# Patient Record
Sex: Female | Born: 1955 | Race: White | Hispanic: No | Marital: Married | Smoking: Former smoker
Health system: Southern US, Community
[De-identification: ages and names within clinical notes are randomized; demographics above are authoritative.]

## PROBLEM LIST (undated history)

## (undated) DIAGNOSIS — E7800 Pure hypercholesterolemia, unspecified: Secondary | ICD-10-CM

## (undated) DIAGNOSIS — I10 Essential (primary) hypertension: Secondary | ICD-10-CM

## (undated) DIAGNOSIS — F32A Depression, unspecified: Secondary | ICD-10-CM

## (undated) DIAGNOSIS — E119 Type 2 diabetes mellitus without complications: Secondary | ICD-10-CM

## (undated) DIAGNOSIS — F419 Anxiety disorder, unspecified: Secondary | ICD-10-CM

## (undated) DIAGNOSIS — F329 Major depressive disorder, single episode, unspecified: Secondary | ICD-10-CM

## (undated) HISTORY — PX: TONSILLECTOMY: SUR1361

## (undated) HISTORY — PX: ANAL FISSURE REPAIR: SHX2312

---

## 2012-02-17 ENCOUNTER — Emergency Department
Admission: EM | Admit: 2012-02-17 | Discharge: 2012-02-17 | Disposition: A | Discharge status: Home Health | Payer: Managed Care, Other (non HMO) | Source: Home / Self Care | Attending: Emergency Medicine | Admitting: Emergency Medicine

## 2012-02-17 DIAGNOSIS — N3000 Acute cystitis without hematuria: Secondary | ICD-10-CM

## 2012-02-17 HISTORY — DX: Essential (primary) hypertension: I10

## 2012-02-17 HISTORY — DX: Anxiety disorder, unspecified: F41.9

## 2012-02-17 HISTORY — DX: Depression, unspecified: F32.A

## 2012-02-17 HISTORY — DX: Major depressive disorder, single episode, unspecified: F32.9

## 2012-02-17 HISTORY — DX: Pure hypercholesterolemia, unspecified: E78.00

## 2012-02-17 LAB — POCT URINALYSIS DIP (MANUAL ENTRY)
Bilirubin, UA: NEGATIVE
Blood, UA: NEGATIVE
Glucose, UA: 100
Nitrite, UA: POSITIVE
Spec Grav, UA: 1.01 (ref 1.005–1.03)
Urobilinogen, UA: 1 (ref 0–1)
pH, UA: 6 (ref 5–8)

## 2012-02-17 MED ORDER — CIPROFLOXACIN HCL 500 MG PO TABS: 500.0000 mg | ORAL_TABLET | Freq: Two times a day (BID) | ORAL | Status: AC

## 2012-02-17 NOTE — ED Provider Notes (Signed)
History    This is a 56 y.o. female who presents today with UTI symptoms for 1 day.  + dysuria + frequency + urgency No hematuria No vaginal discharge No fever/chills No lower abdominal pain No nausea No vomiting No back pain No fatigue She denies chance of pregnancy. Has tried over-the-counter measures without improvement.    CSN: [id number]  Arrival date & time 02/17/12  1831   First MD Initiated Contact with Patient 02/17/12 1837      Chief Complaint  Patient presents with  . Urinary Frequency     HPI  Past Medical History  Diagnosis Date  . Hypertension   . Depression   . Hypercholesteremia   . Anxiety     Past Surgical History  Procedure Date  . Tonsillectomy   . Cesarean section   . Anal fissure repair     No family history on file.  History  Substance Use Topics  . Smoking status: Current Everyday Smoker  . Smokeless tobacco: Not on file  . Alcohol Use: Yes    OB History    Grav Para Term Preterm Abortions TAB SAB Ect Mult Living                  Review of Systems  Constitutional: Negative.   HENT: Negative.   Eyes: Negative.   Respiratory: Negative.   Cardiovascular: Negative.   Gastrointestinal: Negative.   Genitourinary: Positive for dysuria, urgency and frequency.  Musculoskeletal: Negative.   Neurological: Negative.   Hematological: Negative.   Psychiatric/Behavioral: Negative.     Allergies  Penicillins and Sulfa antibiotics  Home Medications   Current Outpatient Rx  Name Route Sig Dispense Refill  . ALPRAZOLAM 1 MG PO TABS Oral Take 1 mg by mouth at bedtime as needed.    Marland Kitchen AMITRIPTYLINE HCL 150 MG PO TABS Oral Take 150 mg by mouth at bedtime.    . ATORVASTATIN CALCIUM 10 MG PO TABS Oral Take 10 mg by mouth daily.    Marland Kitchen LISINOPRIL 20 MG PO TABS Oral Take 20 mg by mouth daily.    Marland Kitchen CIPROFLOXACIN HCL 500 MG PO TABS Oral Take 1 tablet (500 mg total) by mouth 2 (two) times daily. 14 tablet 0    BP 130/88  Pulse 101   Temp 97.7 F (36.5 C) (Oral)  Resp 16  SpO2 99%  Physical Exam  Nursing note and vitals reviewed. Constitutional: She is oriented to person, place, and time. She appears well-developed and well-nourished. No distress.  HENT:  Mouth/Throat: Oropharynx is clear and moist.  Eyes: No scleral icterus.  Neck: Neck supple.  Cardiovascular: Normal rate, regular rhythm and normal heart sounds.   Pulmonary/Chest: Breath sounds normal.  Abdominal: Soft. She exhibits no mass. There is no hepatosplenomegaly. There is tenderness in the suprapubic area. There is no rebound, no guarding and no CVA tenderness.  Lymphadenopathy:    She has no cervical adenopathy.  Neurological: She is alert and oriented to person, place, and time.  Skin: Skin is warm and dry.    ED Course  Procedures (including critical care time)   Labs Reviewed  POCT URINALYSIS DIP (MANUAL ENTRY)  URINE CULTURE   No results found.   1. Cystitis, acute       MDM  Acute cystitis, uncomplicated. Urinalysis positive for nitrates and leukocytes. (UA positive for 100 mg per DL glucose, consistent with her diabetes-she otherwise denies any signs or symptoms or hyperglycemia, and she states her diabetes is fairly well  controlled, followed ongoing by her PCP)  Treatment options discussed. Cipro prescribed for 7 days. Other symptomatic care discussed. Questions invited and answered. Patient and her husband voiced understanding and agreement. Urine culture sent off, especially because she's had UTIs in the past. I explained that she would need to followup with her PCP after a week of treatment to be sure that the urinary infection has resolved and the UA has cleared. Other potential red flags discussed. Followup here sooner if any worsening or new symptoms. Also advised to followup with PCP for ongoing management of diabetes and other problems.        Lajean Manes, MD 02/17/12 [phone]

## 2012-02-17 NOTE — ED Notes (Signed)
Pt c/o increased urgency and frequency of urination.  Pt denies dysuria, hematuria.  Onset of SX this am, pt began taking AZO.

## 2012-02-19 LAB — URINE CULTURE
Colony Count: NO GROWTH
Organism ID, Bacteria: NO GROWTH

## 2012-02-20 ENCOUNTER — Telehealth: Payer: Self-pay | Admitting: Family Medicine

## 2012-02-22 ENCOUNTER — Telehealth: Payer: Self-pay | Admitting: *Deleted

## 2012-11-09 ENCOUNTER — Encounter: Payer: Self-pay | Admitting: *Deleted

## 2012-11-09 ENCOUNTER — Emergency Department
Admission: EM | Admit: 2012-11-09 | Discharge: 2012-11-09 | Disposition: A | Discharge status: Home / Self Care | Payer: Managed Care, Other (non HMO) | Source: Home / Self Care | Attending: Family Medicine | Admitting: Family Medicine

## 2012-11-09 DIAGNOSIS — M549 Dorsalgia, unspecified: Secondary | ICD-10-CM

## 2012-11-09 DIAGNOSIS — R319 Hematuria, unspecified: Secondary | ICD-10-CM

## 2012-11-09 HISTORY — DX: Type 2 diabetes mellitus without complications: E11.9

## 2012-11-09 LAB — POCT CBC W AUTO DIFF (K'VILLE URGENT CARE)

## 2012-11-09 LAB — POCT URINALYSIS DIP (MANUAL ENTRY)
Bilirubin, UA: NEGATIVE
Glucose, UA: NEGATIVE
Ketones, POC UA: NEGATIVE
Leukocytes, UA: NEGATIVE
Nitrite, UA: NEGATIVE
pH, UA: 6 (ref 5–8)

## 2012-11-09 MED ORDER — KETOROLAC TROMETHAMINE 30 MG/ML IJ SOLN
30.0000 mg | Freq: Once | INTRAMUSCULAR | Status: AC
  Administered 2012-11-09: 30 mg via INTRAMUSCULAR

## 2012-11-09 MED ORDER — CEPHALEXIN 500 MG PO CAPS: 500.0000 mg | ORAL_CAPSULE | Freq: Three times a day (TID) | ORAL | Status: DC

## 2012-11-09 NOTE — ED Provider Notes (Signed)
History     CSN: [id number]  Arrival date & time 11/09/12  1236   First MD Initiated Contact with Patient 11/09/12 1353      Chief Complaint  Patient presents with  . Back Pain       HPI Comments: Patient noticed mild suprapubic bladder pressure two days ago.  She awoke this morning with lower back pain, initially on the right and now both sides.  The pain has radiated somewhat to her abdomen.  She had chills.  She noticed that her urine was dark, and she has had urinary urgency but no dysuria.  No nausea/vomiting.   No pelvic pain and no vaginal discharge.  She had a normal pap smear about 6 months ago. She had a kidney stone when she was in her thirty's.  Patient is a 57 y.o. female presenting with back pain. The history is provided by the patient.  Back Pain Location:  Lumbar spine Quality:  Aching Radiates to: abdomen. Pain severity:  Moderate Pain is:  Worse during the day Onset quality:  Sudden Duration:  6 hours Timing:  Constant Progression:  Improving Chronicity:  New Relieved by:  Nothing Ineffective treatments:  None tried Associated symptoms: abdominal pain   Associated symptoms: no abdominal swelling, no bladder incontinence, no bowel incontinence, no chest pain, no dysuria, no fever, no numbness, no paresthesias, no pelvic pain, no perianal numbness and no tingling     Past Medical History  Diagnosis Date  . Hypertension   .  Depression   . Hypercholesteremia   . Anxiety   . Diabetes mellitus without complication     Past Surgical History  Procedure Laterality Date  . Tonsillectomy    . Cesarean section    . Anal fissure repair      Family History  Problem Relation Age of Onset  . Diabetes Mother     History  Substance Use Topics  . Smoking status: Current Every Day Smoker  . Smokeless tobacco: Never Used  . Alcohol Use: Yes    OB History   Grav Para Term Preterm Abortions TAB SAB Ect Mult Living                  Review of Systems  Constitutional: Negative for fever.  Cardiovascular: Negative for chest pain.  Gastrointestinal: Positive for abdominal pain. Negative for bowel incontinence.  Genitourinary: Negative for bladder incontinence, dysuria and pelvic pain.  Musculoskeletal: Positive for back pain.  Neurological: Negative for tingling, numbness and paresthesias.  All other systems reviewed and are negative.    Allergies  Penicillins and Sulfa antibiotics  Home Medications   Current Outpatient Rx  Name  Route  Sig  Dispense  Refill  . metFORMIN (GLUCOPHAGE) 500 MG tablet   Oral   Take 500 mg by mouth 2 (two) times daily with a meal.         . ALPRAZolam (XANAX) 1 MG tablet   Oral   Take 1 mg by mouth at bedtime as needed.         Marland Kitchen amitriptyline (ELAVIL) 150 MG tablet   Oral   Take 150 mg by mouth at bedtime.         Marland Kitchen atorvastatin (LIPITOR) 10 MG tablet   Oral   Take 10 mg by mouth daily.         . cephALEXin (KEFLEX) 500 MG capsule   Oral   Take 1 capsule (500 mg total) by mouth 3 (three) times daily.   30 capsule   0   . lisinopril (PRINIVIL,ZESTRIL) 20 MG tablet   Oral   Take 20 mg by mouth daily.           BP 149/98  Pulse 98  Temp(Src) 98 F (36.7 C) (Oral)  Resp 14  Ht 5\' 5"  (1.651 m)  Wt 163 lb (73.936 kg)  BMI 27.12 kg/m2  SpO2 99%  Physical Exam Nursing notes and Vital Signs reviewed. Appearance:  Patient appears healthy,  stated age, and in no acute distress Eyes:  Pupils are equal, round, and reactive to light and accomodation.  Extraocular movement is intact.  Conjunctivae are not inflamed   Nose:   Normal turbinates.  No sinus tenderness.   Pharynx:  Normal Neck:  Supple.  No adenopathy Lungs:  Clear to  auscultation.  Breath sounds are equal.  Heart:  Regular rate and rhythm without murmurs, rubs, or gallops.  Abdomen:   Vague peri-umbilical tenderness without masses or hepatosplenomegaly.  Bowel sounds are present and increased.  Right flank tenderness is present.                                                                   Extremities:  No edema.  No calf tenderness Skin:  No rash present.   ED Course  Procedures  none  Labs Reviewed  URINE CULTURE pending  POCT URINALYSIS DIP (MANUAL ENTRY) BLO large; PRO 100mg /dL, otherwise negative  POCT CBC W AUTO DIFF ([facility])  WBC 7.7; LY 24.3; MO 4.0; GR 71.7; Hgb 12.3; Platelets 229       1. Hematuria; ? Recurrent nephrolithiasis   2. Back pain       MDM  Urine culture pending Begin Keflex. Increase fluid intake.  May take Tylenol for pain. Followup with Family Doctor if not improved in four days.  If symptoms become significantly worse during the night or over the weekend, proceed to the local emergency room.        Lattie Haw, MD 11/09/12 [phone]

## 2012-11-09 NOTE — ED Notes (Signed)
Margaret Mcgee c/o low back pain and dark urine x this am. Taken tylenol with relief for 2 hours.

## 2012-11-12 ENCOUNTER — Telehealth: Payer: Self-pay | Admitting: *Deleted

## 2012-11-12 LAB — URINE CULTURE

## 2012-11-25 ENCOUNTER — Emergency Department
Admission: EM | Admit: 2012-11-25 | Discharge: 2012-11-25 | Disposition: A | Discharge status: Home / Self Care | Payer: Managed Care, Other (non HMO) | Source: Home / Self Care

## 2012-11-25 DIAGNOSIS — Z716 Tobacco abuse counseling: Secondary | ICD-10-CM

## 2012-11-25 DIAGNOSIS — N390 Urinary tract infection, site not specified: Secondary | ICD-10-CM

## 2012-11-25 LAB — POCT URINALYSIS DIP (MANUAL ENTRY)
Bilirubin, UA: NEGATIVE
Glucose, UA: NEGATIVE
Leukocytes, UA: NEGATIVE
pH, UA: 5.5 (ref 5–8)

## 2012-11-25 MED ORDER — NITROFURANTOIN MONOHYD MACRO 100 MG PO CAPS: 100.0000 mg | ORAL_CAPSULE | Freq: Two times a day (BID) | ORAL | Status: DC

## 2012-11-25 NOTE — ED Provider Notes (Signed)
History     CSN: [id number]  Arrival date & time 11/25/12  1659   None     Chief Complaint  Patient presents with  . Back Pain  . Urinary Urgency   HPI  DYSURIA Onset:  3-4 days  Description: dysuria, increased urinary frequency and urgency  Modifying factors: was seen 4/3 for similar sxs. Completed 10+ day course of kelfex with improvement in sxs. Sxs then recurred over last 4-5 days   Symptoms Urgency:  yes Frequency: yes  Hesitancy:  yes Hematuria:  no Flank Pain:  no Fever: no Nausea/Vomiting:  no Missed LMP: no STD exposure: no Discharge: no Irritants: no Rash: no  Red Flags   More than 3 UTI's last 12 months:  yes PMH of  Diabetes or Immunosuppression:  Yes; last A1C 6.9  Renal Disease/Calculi: prior history 10+ years ago  Urinary Tract Abnormality:  no Instrumentation or Trauma: no    Past Medical History  Diagnosis Date  . Hypertension   . Depression   . Hypercholesteremia   . Anxiety   . Diabetes mellitus without complication     Past Surgical History  Procedure Laterality Date  . Tonsillectomy    . Cesarean section    . Anal fissure repair      Family History  Problem Relation Age of Onset  . Diabetes Mother     History  Substance Use Topics  . Smoking status: Current Every Day Smoker  . Smokeless tobacco: Never Used  . Alcohol Use: Yes    OB History   Grav Para Term Preterm Abortions TAB SAB Ect Mult Living                  Review of Systems  All other systems reviewed and are negative.    Allergies  Penicillins and Sulfa antibiotics  Home Medications   Current Outpatient Rx  Name  Route  Sig  Dispense  Refill  . ALPRAZolam (XANAX) 1 MG tablet   Oral   Take 1 mg by mouth at bedtime as needed.         Marland Kitchen amitriptyline (ELAVIL) 150 MG tablet   Oral   Take 150 mg by mouth at bedtime.         Marland Kitchen atorvastatin (LIPITOR) 10 MG tablet   Oral   Take 10 mg by mouth daily.         . cephALEXin (KEFLEX) 500 MG  capsule   Oral   Take 1 capsule (500 mg total) by mouth 3 (three) times daily.   30 capsule   0   . lisinopril (PRINIVIL,ZESTRIL) 20 MG tablet   Oral   Take 20 mg by mouth daily.         . metFORMIN (GLUCOPHAGE) 500 MG tablet   Oral   Take 500 mg by mouth 2 (two) times daily with a meal.           BP 133/83  Pulse 108  Temp(Src) 98.1 F (36.7 C) (Oral)  Resp 20  Ht 5\' 5"  (1.651 m)  Wt 163 lb (73.936 kg)  BMI 27.12 kg/m2  SpO2 97%  Physical Exam  Constitutional: She appears well-developed and well-nourished.  HENT:  Head: Normocephalic and atraumatic.  Eyes: Conjunctivae are normal. Pupils are equal, round, and reactive to light.  Neck: Neck supple.  Cardiovascular: Normal rate and regular rhythm.   Pulmonary/Chest: Effort normal and breath sounds normal.  Abdominal: Soft. Bowel sounds are normal.  No flank pain  +  mild suprapubic tenderness   Musculoskeletal: Normal range of motion.  Neurological: She is alert.  Skin: Skin is warm.    ED Course  Procedures (including critical care time)  Labs Reviewed  URINE CULTURE  POCT URINALYSIS DIP (MANUAL ENTRY)   No results found.   1. Recurrent UTI   2. Tobacco abuse counseling       MDM  Will place on course of Macrobid for treatment. Patient states this is been effective in managing UTIs in the past No hematuria on urinalysis today. Will do a urine culture. No clinical indications for imaging today. Plan for patient to followup with urology as this is become recurrent issue in setting of other comorbidities including diabetes. Discussed general, infectious, genitourinary red flags. Followup as needed. Also discussed smoking cessation.    The patient and/or caregiver has been counseled thoroughly with regard to treatment plan and/or medications prescribed including dosage, schedule, interactions, rationale for use, and possible side effects and they verbalize understanding. Diagnoses and expected  course of recovery discussed and will return if not improved as expected or if the condition worsens. Patient and/or caregiver verbalized understanding.             Doree Albee, MD 11/25/12 1750

## 2012-11-25 NOTE — ED Notes (Signed)
Seen here on the 3rd for UTI, completed ABT on the 14th and took some of her husband's ABT, cont with c/o urinary urgency and back pain

## 2014-03-29 ENCOUNTER — Other Ambulatory Visit: Payer: Self-pay

## 2014-07-26 ENCOUNTER — Emergency Department
Admission: EM | Admit: 2014-07-26 | Discharge: 2014-07-26 | Disposition: A | Discharge status: Home / Self Care | Payer: Managed Care, Other (non HMO) | Source: Home / Self Care | Attending: Emergency Medicine | Admitting: Emergency Medicine

## 2014-07-26 ENCOUNTER — Encounter: Payer: Self-pay | Admitting: *Deleted

## 2014-07-26 DIAGNOSIS — N3000 Acute cystitis without hematuria: Secondary | ICD-10-CM

## 2014-07-26 DIAGNOSIS — R300 Dysuria: Secondary | ICD-10-CM

## 2014-07-26 LAB — POCT URINALYSIS DIP (MANUAL ENTRY)
Bilirubin, UA: NEGATIVE
Blood, UA: NEGATIVE
Glucose, UA: NEGATIVE
Ketones, POC UA: NEGATIVE
Nitrite, UA: POSITIVE
Protein Ur, POC: NEGATIVE
Spec Grav, UA: 1.01 (ref 1.005–1.03)
Urobilinogen, UA: 0.2 (ref 0–1)
pH, UA: 7 (ref 5–8)

## 2014-07-26 MED ORDER — NITROFURANTOIN MONOHYD MACRO 100 MG PO CAPS: 100.0000 mg | ORAL_CAPSULE | Freq: Two times a day (BID) | ORAL | Status: DC

## 2014-07-26 NOTE — ED Provider Notes (Signed)
CSN: [id number]     Arrival date & time 07/26/14  1824 History   First MD Initiated Contact with Patient 07/26/14 1852     Chief Complaint  Patient presents with  . Urinary Tract Infection   (Consider location/radiation/quality/duration/timing/severity/associated sxs/prior Treatment) HPI This is a 58 y.o. female who presents today with UTI symptoms for 1 days.  + dysuria + frequency + urgency No hematuria No vaginal discharge No fever/chills No lower abdominal pain No nausea No vomiting No back pain No fatigue She denies chance of pregnancy. Has tried over-the-counter measures without improvement.    Past Medical History  Diagnosis Date  . Hypertension   . Depression   . Hypercholesteremia   . Anxiety   . Diabetes mellitus without complication    Past Surgical History  Procedure Laterality Date  . Tonsillectomy    . Cesarean section    . Anal fissure repair     Family History  Problem Relation Age of Onset  . Diabetes Mother    History  Substance Use Topics  . Smoking status: Current Every Day Smoker  . Smokeless tobacco: Never Used  . Alcohol Use: Yes   OB History    No data available     Review of Systems  All other systems reviewed and are negative.   Allergies  Penicillins and Sulfa antibiotics  Home Medications   Prior to Admission medications   Medication Sig Start Date End Date Taking? Authorizing Provider  ALPRAZolam Duanne Moron) 1 MG tablet Take 1 mg by mouth at bedtime as needed.    Historical Provider, MD  amitriptyline (ELAVIL) 150 MG tablet Take 150 mg by mouth at bedtime.    Historical Provider, MD  atorvastatin (LIPITOR) 10 MG tablet Take 10 mg by mouth daily.    Historical Provider, MD  lisinopril (PRINIVIL,ZESTRIL) 20 MG tablet Take 20 mg by mouth daily.    Historical Provider, MD  metFORMIN (GLUCOPHAGE) 500 MG tablet Take 500 mg by mouth 2 (two) times daily with a meal.    Historical Provider, MD  nitrofurantoin,  macrocrystal-monohydrate, (MACROBID) 100 MG capsule Take 1 capsule (100 mg total) by mouth 2 (two) times daily. 07/26/14   Jacqulyn Cane, MD   BP 127/88 mmHg  Pulse 99  Temp(Src) 97.7 F (36.5 C) (Oral)  Resp 16  Wt 178 lb (80.74 kg)  SpO2 99% Physical Exam  Constitutional: She is oriented to person, place, and time. She appears well-developed and well-nourished. No distress.  HENT:  Mouth/Throat: Oropharynx is clear and moist.  Eyes: No scleral icterus.  Neck: Neck supple.  Cardiovascular: Normal rate, regular rhythm and normal heart sounds.   Pulmonary/Chest: Breath sounds normal.  Abdominal: Soft. She exhibits no mass. There is no hepatosplenomegaly. There is tenderness in the suprapubic area. There is no rebound, no guarding and no CVA tenderness.  Lymphadenopathy:    She has no cervical adenopathy.  Neurological: She is alert and oriented to person, place, and time.  Skin: Skin is warm and dry.  Nursing note and vitals reviewed.   ED Course  Procedures (including critical care time) Labs Review Labs Reviewed  URINE CULTURE  POCT URINALYSIS DIP (MANUAL ENTRY)   Results for orders placed or performed during the hospital encounter of 07/26/14  POCT urinalysis dipstick (new)  Result Value Ref Range   Color, UA yellow    Clarity, UA clear    Glucose, UA neg    Bilirubin, UA negative    Bilirubin, UA negative  Spec Grav, UA 1.010 1.005 - 1.03   Blood, UA negative    pH, UA 7.0 5 - 8   Protein Ur, POC negative    Urobilinogen, UA 0.2 0 - 1   Nitrite, UA Positive    Leukocytes, UA Trace      Imaging Review No results found.   MDM   1. Dysuria   2. Acute cystitis without hematuria     Discharge Medication List as of 07/26/2014  7:00 PM    START taking these medications   Details  nitrofurantoin, macrocrystal-monohydrate, (MACROBID) 100 MG capsule Take 1 capsule (100 mg total) by mouth 2 (two) times daily., Starting 07/26/2014, Until Discontinued, Normal        Urine culture, other symptomatic care discussed Over 15 minutes spent, greater than 50% of the time spent for counseling and coordination of care. Follow-up with your primary care doctor in 5-7 days if not improving, or sooner if symptoms become worse. Precautions discussed. Red flags discussed. Questions invited and answered. Patient voiced understanding and agreement.     Jacqulyn Cane, MD 07/26/14 [phone]

## 2014-07-26 NOTE — ED Notes (Signed)
Margaret Mcgee c/o dysuria and polyuria x this AM. Denies fever. Taken AZO.

## 2014-07-29 LAB — URINE CULTURE: Colony Count: 30000

## 2014-07-30 ENCOUNTER — Telehealth: Payer: Self-pay | Admitting: *Deleted

## 2015-04-23 ENCOUNTER — Encounter: Payer: Self-pay | Admitting: *Deleted

## 2015-04-23 ENCOUNTER — Emergency Department (INDEPENDENT_AMBULATORY_CARE_PROVIDER_SITE_OTHER): Payer: Managed Care, Other (non HMO)

## 2015-04-23 ENCOUNTER — Emergency Department
Admission: EM | Admit: 2015-04-23 | Discharge: 2015-04-23 | Disposition: A | Discharge status: Home / Self Care | Payer: Managed Care, Other (non HMO) | Source: Home / Self Care | Attending: Family Medicine | Admitting: Family Medicine

## 2015-04-23 DIAGNOSIS — R509 Fever, unspecified: Secondary | ICD-10-CM

## 2015-04-23 DIAGNOSIS — J189 Pneumonia, unspecified organism: Secondary | ICD-10-CM | POA: Diagnosis not present

## 2015-04-23 DIAGNOSIS — R05 Cough: Secondary | ICD-10-CM | POA: Diagnosis not present

## 2015-04-23 MED ORDER — AZITHROMYCIN 250 MG PO TABS: 250.0000 mg | ORAL_TABLET | Freq: Every day | ORAL | Status: DC

## 2015-04-23 MED ORDER — BENZONATATE 100 MG PO CAPS: 100.0000 mg | ORAL_CAPSULE | Freq: Three times a day (TID) | ORAL | Status: DC

## 2015-04-23 NOTE — Discharge Instructions (Signed)
Please take antibiotics as prescribed and be sure to complete entire course even if you start to feel better to ensure infection does not come back. ° ° °You may take 400-600mg Ibuprofen (Motrin) every 6-8 hours for fever and pain  °Alternate with Tylenol  °You may take 500mg Tylenol every 4-6 hours as needed for fever and pain  °Follow-up with your primary care provider next week for recheck of symptoms if not improving.  °Be sure to drink plenty of fluids and rest, at least 8hrs of sleep a night, preferably more while you are sick. °Return urgent care or go to closest ER if you cannot keep down fluids/signs of dehydration, fever not reducing with Tylenol, difficulty breathing/wheezing, stiff neck, worsening condition, or other concerns (see below)  ° °

## 2015-04-23 NOTE — ED Provider Notes (Signed)
CSN: [id number]     Arrival date & time 04/23/15  1407 History   First MD Initiated Contact with Patient 04/23/15 1412     Chief Complaint  Patient presents with  . Cough   (Consider location/radiation/quality/duration/timing/severity/associated sxs/prior Treatment) HPI  Pt is a 59yo female presenting to [facility] with c/o worsening 2 day hx of body aches, fever Tmax 101 F, moderate intermittent non-productive cough with centralized chest pain with the cough.  Pt states her husband just finished a 2 week course of antibiotics for similar symptoms.  She has tried guaifenesin cough medicine w/o relief.  Denies hx of asthma or COPD. Denies n/v/d. States she did recently come back from a [employer] but no other travel.  Past Medical History  Diagnosis Date  . Hypertension   . Depression   . Hypercholesteremia   . Anxiety   . Diabetes mellitus without complication    Past Surgical History  Procedure Laterality Date  . Tonsillectomy    . Cesarean section    . Anal fissure repair     Family History  Problem Relation Age of Onset  . Diabetes Mother    Social History  Substance Use Topics  . Smoking status: Former Research scientist (life sciences)  . Smokeless tobacco: Never Used  . Alcohol Use: Yes   OB History    No data available     Review of Systems  Constitutional: Positive for fever, chills, appetite change and fatigue. Negative for diaphoresis.  HENT: Negative for congestion, ear pain, sore throat, trouble swallowing and voice change.   Respiratory: Positive for cough and chest tightness. Negative for choking, shortness of breath, wheezing and stridor.   Cardiovascular: Positive for chest pain. Negative for palpitations.  Gastrointestinal: Negative for nausea, vomiting, abdominal pain and diarrhea.  Musculoskeletal: Positive for myalgias and arthralgias. Negative for back pain.  Skin: Negative for rash.  All other systems reviewed and are negative.   Allergies  Penicillins and Sulfa  antibiotics  Home Medications   Prior to Admission medications   Medication Sig Start Date End Date Taking? Authorizing Provider  ALPRAZolam Duanne Moron) 1 MG tablet Take 1 mg by mouth at bedtime as needed.   Yes Historical Provider, MD  amitriptyline (ELAVIL) 150 MG tablet Take 150 mg by mouth at bedtime.   Yes Historical Provider, MD  lisinopril (PRINIVIL,ZESTRIL) 20 MG tablet Take 20 mg by mouth daily.   Yes Historical Provider, MD  metFORMIN (GLUCOPHAGE) 500 MG tablet Take 500 mg by mouth 2 (two) times daily with a meal.   Yes Historical Provider, MD  azithromycin (ZITHROMAX) 250 MG tablet Take 1 tablet (250 mg total) by mouth daily. Take first 2 tablets together, then 1 every day until finished. 04/23/15   Noland Fordyce, PA-C  benzonatate (TESSALON) 100 MG capsule Take 1 capsule (100 mg total) by mouth every 8 (eight) hours. 04/23/15   Noland Fordyce, PA-C   Meds Ordered and Administered this Visit  Medications - No data to display  BP 150/95 mmHg  Pulse 100  Temp(Src) 98.1 F (36.7 C) (Oral)  Resp 18  Ht 5\' 5"  (1.651 m)  Wt 180 lb (81.647 kg)  BMI 29.95 kg/m2  SpO2 98% No data found.   Physical Exam  Constitutional: She appears well-developed and well-nourished. No distress.  HENT:  Head: Normocephalic and atraumatic.  Right Ear: Hearing, tympanic membrane, external ear and ear canal normal.  Left Ear: Hearing, tympanic membrane, external ear and ear canal normal.  Nose: Nose normal.  Mouth/Throat:  Uvula is midline, oropharynx is clear and moist and mucous membranes are normal.  Eyes: Conjunctivae are normal. No scleral icterus.  Neck: Normal range of motion. Neck supple.  Cardiovascular: Normal rate, regular rhythm and normal heart sounds.   Pulmonary/Chest: Effort normal and breath sounds normal. No respiratory distress. She has no wheezes. She has no rales. She exhibits no tenderness.  Intermittent dry cough throughout exam. No respiratory distress. Able to speak in full  sentences. Decreased breath sounds in lower lung fields bilaterally. No wheeze or rhonchi.  Abdominal: Soft. Bowel sounds are normal. She exhibits no distension and no mass. There is no tenderness. There is no rebound and no guarding.  Musculoskeletal: Normal range of motion.  Neurological: She is alert.  Skin: Skin is warm and dry. She is not diaphoretic.  Nursing note and vitals reviewed.   ED Course  Procedures (including critical care time)  Labs Review Labs Reviewed - No data to display  Imaging Review Dg Chest 2 View  04/23/2015   CLINICAL DATA:  Fever and nonproductive cough  EXAM: CHEST  2 VIEW  COMPARISON:  None.  FINDINGS: Indistinct opacity in the right anterior lung, best seen in the lateral projection. The new no effusion or cavitation. Normal heart size and aortic contours. No acute osseous findings.  IMPRESSION: Right lung opacity, likely bronchopneumonia. Followup PA and lateral chest X-ray is recommended in 3-4 weeks following trial of antibiotic therapy to ensure resolution and exclude underlying malignancy.   Electronically Signed   By: Monte Fantasia M.D.   On: 04/23/2015 14:58       MDM   1. CAP (community acquired pneumonia)     Pt c/u cough, chest pain, fever and body aches. Husband recently treated for pneumonia.  Pt does have intermittent non-productive cough on exam but no respiratory distress. O2 Sat 98% on RA. Afebrile.  CXR: Right lung opacity, likely bronchopneumonia. F/u CXR in 3-4 weeks to ensure resolution and exclude underlying malignancy.  Rx: azithromycin and tessalon Advised to f/u with PCP in 3-4 days for repeat CXR, sooner if worsening. Discussed symptoms that warrant emergent care in the ED. Patient verbalized understanding and agreement with treatment plan.     Noland Fordyce, PA-C 04/23/15 1515

## 2015-04-23 NOTE — ED Notes (Signed)
Pt c/o body aches, nonproductive cough, chest hurts in the center, and temp 101 x 2 days.

## 2015-04-25 ENCOUNTER — Telehealth: Payer: Self-pay

## 2016-02-01 ENCOUNTER — Emergency Department (INDEPENDENT_AMBULATORY_CARE_PROVIDER_SITE_OTHER)
Admission: EM | Admit: 2016-02-01 | Discharge: 2016-02-01 | Disposition: A | Discharge status: Home / Self Care | Payer: BLUE CROSS/BLUE SHIELD | Source: Home / Self Care | Attending: Family Medicine | Admitting: Family Medicine

## 2016-02-01 ENCOUNTER — Encounter: Payer: Self-pay | Admitting: Emergency Medicine

## 2016-02-01 DIAGNOSIS — D171 Benign lipomatous neoplasm of skin and subcutaneous tissue of trunk: Secondary | ICD-10-CM | POA: Diagnosis not present

## 2016-02-01 NOTE — Discharge Instructions (Signed)

## 2016-02-01 NOTE — ED Notes (Signed)
Reports noticing a soft, non painful lump on right scapula, yesterday. No injury to area.

## 2016-02-01 NOTE — ED Provider Notes (Signed)
CSN: [id number]     Arrival date & time 02/01/16  1740 History   First MD Initiated Contact with Patient 02/01/16 1910     Chief Complaint  Patient presents with  . Mass      HPI Comments: Patient reports that she visualized a non-painful lump in her right scapular area yesterday.  No known injury.  She does not know how long the lump has been present.  She feels well otherwise.  No discomfort when supine.  No pleuritic pain.  The history is provided by the patient.    Past Medical History  Diagnosis Date  . Hypertension   . Depression   . Hypercholesteremia   . Anxiety   . Diabetes mellitus without complication [facility])    Past Surgical History  Procedure Laterality Date  . Tonsillectomy    . Cesarean section    . Anal fissure repair     Family History  Problem Relation Age of Onset  . Diabetes Mother    Social History  Substance Use Topics  . Smoking status: Former Research scientist (life sciences)  . Smokeless tobacco: Never Used  . Alcohol Use: Yes   OB History    No data available     Review of Systems  Constitutional: Negative for fever, chills, diaphoresis, activity change, appetite change, fatigue and unexpected weight change.  HENT: Negative.   Eyes: Negative.   Respiratory: Negative.   Cardiovascular: Negative.   Gastrointestinal: Negative.   Genitourinary: Negative.   Musculoskeletal: Negative.   Skin: Negative for color change.  Neurological: Negative for headaches.    Allergies  Penicillins and Sulfa antibiotics  Home Medications   Prior to Admission medications   Medication Sig Start Date End Date Taking? Authorizing Provider  canagliflozin (INVOKANA) 300 MG TABS tablet Take 300 mg by mouth daily before breakfast.   Yes Historical Provider, MD  ALPRAZolam (XANAX) 1 MG tablet Take 1 mg by mouth at bedtime as needed.    Historical Provider, MD  amitriptyline (ELAVIL) 150 MG tablet Take 150 mg by mouth at bedtime.    Historical Provider, MD  azithromycin (ZITHROMAX) 250 MG  tablet Take 1 tablet (250 mg total) by mouth daily. Take first 2 tablets together, then 1 every day until finished. 04/23/15   Noland Fordyce, PA-C  benzonatate (TESSALON) 100 MG capsule Take 1 capsule (100 mg total) by mouth every 8 (eight) hours. 04/23/15   Noland Fordyce, PA-C  lisinopril (PRINIVIL,ZESTRIL) 20 MG tablet Take 20 mg by mouth daily.    Historical Provider, MD  metFORMIN (GLUCOPHAGE) 500 MG tablet Take 500 mg by mouth 2 (two) times daily with a meal.    Historical Provider, MD   Meds Ordered and Administered this Visit  Medications - No data to display  BP 125/80 mmHg  Pulse 100  Temp(Src) 97.5 F (36.4 C) (Oral)  Resp 16  Ht 5\' 5"  (1.651 m)  Wt 169 lb (76.658 kg)  BMI 28.12 kg/m2  SpO2 98% No data found.   Physical Exam  Constitutional: She is oriented to person, place, and time. She appears well-developed and well-nourished. No distress.  HENT:  Head: Normocephalic.  Right Ear: External ear normal.  Left Ear: External ear normal.  Nose: Nose normal.  Mouth/Throat: Oropharynx is clear and moist.  Eyes: Conjunctivae and EOM are normal. Pupils are equal, round, and reactive to light.  Neck: Neck supple.  Cardiovascular: Normal heart sounds.   Pulmonary/Chest: Breath sounds normal.   She exhibits no tenderness.  Posteriorly over the  right scapula is a subcutaneous nontender soft tissue mass as noted on diagram.  The mass is mobile with manipulation and has the consistency of a lipoma.  No overlying skin changes.    Abdominal: There is no tenderness.  Musculoskeletal: She exhibits no edema.  Lymphadenopathy:    She has no cervical adenopathy.  Neurological: She is alert and oriented to person, place, and time.  Skin: Skin is warm and dry. No rash noted.  Nursing note and vitals reviewed.   ED Course  Procedures none  MDM   1. Lipoma of back    Reassurance Patient does not know how long mass has been present.  As a precaution, will schedule ultrasound of  lesion.    Kandra Nicolas, MD 02/08/16 [phone]

## 2016-02-02 ENCOUNTER — Telehealth: Payer: Self-pay | Admitting: *Deleted

## 2016-02-02 ENCOUNTER — Other Ambulatory Visit: Payer: BLUE CROSS/BLUE SHIELD

## 2016-02-02 NOTE — ED Notes (Signed)
Patient was seen on Sunday, 02/01/16. She is in need of an US of the Lipoma on her right scapula. Order placed, imaging will contact patient to schedule [place].

## 2016-02-04 ENCOUNTER — Other Ambulatory Visit: Payer: BLUE CROSS/BLUE SHIELD

## 2016-02-04 ENCOUNTER — Ambulatory Visit (INDEPENDENT_AMBULATORY_CARE_PROVIDER_SITE_OTHER): Payer: BLUE CROSS/BLUE SHIELD

## 2016-02-04 ENCOUNTER — Telehealth: Payer: Self-pay | Admitting: Family Medicine

## 2016-02-04 DIAGNOSIS — M25811 Other specified joint disorders, right shoulder: Secondary | ICD-10-CM

## 2016-02-04 NOTE — ED Notes (Signed)
Encounter created to enter [place] order. Charna Archer, LPN   Kandra Nicolas, MD 02/04/16 [phone]

## 2016-02-26 ENCOUNTER — Telehealth: Payer: Self-pay | Admitting: Emergency Medicine

## 2016-04-18 ENCOUNTER — Encounter: Payer: Self-pay | Admitting: Emergency Medicine

## 2016-04-18 ENCOUNTER — Emergency Department (INDEPENDENT_AMBULATORY_CARE_PROVIDER_SITE_OTHER): Payer: BLUE CROSS/BLUE SHIELD

## 2016-04-18 ENCOUNTER — Emergency Department
Admission: EM | Admit: 2016-04-18 | Discharge: 2016-04-18 | Disposition: A | Discharge status: Home / Self Care | Payer: BLUE CROSS/BLUE SHIELD | Source: Home / Self Care | Attending: Family Medicine | Admitting: Family Medicine

## 2016-04-18 DIAGNOSIS — S82402A Unspecified fracture of shaft of left fibula, initial encounter for closed fracture: Secondary | ICD-10-CM | POA: Diagnosis not present

## 2016-04-18 DIAGNOSIS — X501XXA Overexertion from prolonged static or awkward postures, initial encounter: Secondary | ICD-10-CM

## 2016-04-18 DIAGNOSIS — S82832A Other fracture of upper and lower end of left fibula, initial encounter for closed fracture: Secondary | ICD-10-CM | POA: Diagnosis not present

## 2016-04-18 MED ORDER — IBUPROFEN 600 MG PO TABS: 600.0000 mg | ORAL_TABLET | Freq: Four times a day (QID) | ORAL | 0 refills | Status: DC | PRN

## 2016-04-18 MED ORDER — HYDROCODONE-ACETAMINOPHEN 5-325 MG PO TABS: 1.0000 | ORAL_TABLET | Freq: Four times a day (QID) | ORAL | 0 refills | Status: DC | PRN

## 2016-04-18 NOTE — Discharge Instructions (Signed)
°  You have broken the smaller outer long bone in your lower leg, the Fibula.    Per advise by Dr. Dianah Field, Sports Medicine, do NOT put any weight on your Left leg and follow up with him in 1 week.  Norco/Vicodin (hydrocodone-acetaminophen) is a narcotic pain medication, do not combine these medications with others containing tylenol. While taking, do not drink alcohol, drive, or perform any other activities that requires focus while taking these medications.

## 2016-04-18 NOTE — ED Provider Notes (Signed)
CSN: [id number]     Arrival date & time 04/18/16  1731 History   First MD Initiated Contact with Patient 04/18/16 1807     Chief Complaint  Patient presents with  . Ankle Pain   (Consider location/radiation/quality/duration/timing/severity/associated sxs/prior Treatment) HPI  Shakeera Woodward is a 60 y.o. female presenting to UC with c/o Left ankle pain and swelling that started yesterday after she tripped in the sand while at the beach.  She reports hx of "bad ankles" as she has sprained her Right ankle in the past. Current Left ankle pain is aching and throbbing, unable to bear weight, 8/10, worse with movement of ankle.  Denies other injuries during the fall. She has taken ibuprofen with minimal relief.   Past Medical History:  Diagnosis Date  . Anxiety   . Depression   . Diabetes mellitus without complication ([place])   . Hypercholesteremia   . Hypertension    Past Surgical History:  Procedure Laterality Date  . ANAL FISSURE REPAIR    . CESAREAN SECTION    . TONSILLECTOMY     Family History  Problem Relation Age of Onset  . Diabetes Mother    Social History  Substance Use Topics  . Smoking status: Former Research scientist (life sciences)  . Smokeless tobacco: Never Used  . Alcohol use Yes   OB History    No data available     Review of Systems  Musculoskeletal: Positive for arthralgias, gait problem, joint swelling and myalgias. Negative for back pain.  Skin: Negative for color change, rash and wound.  Neurological: Negative for weakness and numbness.    Allergies  Penicillins and Sulfa antibiotics  Home Medications   Prior to Admission medications   Medication Sig Start Date End Date Taking? Authorizing Provider  ALPRAZolam Duanne Moron) 1 MG tablet Take 1 mg by mouth at bedtime as needed.    Historical Provider, MD  amitriptyline (ELAVIL) 150 MG tablet Take 150 mg by mouth at bedtime.    Historical Provider, MD  canagliflozin (INVOKANA) 300 MG TABS tablet Take 300 mg by mouth daily before  breakfast.    Historical Provider, MD  HYDROcodone-acetaminophen (NORCO/VICODIN) 5-325 MG tablet Take 1-2 tablets by mouth every 6 (six) hours as needed for moderate pain or severe pain. 04/18/16   Noland Fordyce, PA-C  ibuprofen (ADVIL,MOTRIN) 600 MG tablet Take 1 tablet (600 mg total) by mouth every 6 (six) hours as needed. 04/18/16   Noland Fordyce, PA-C  lisinopril (PRINIVIL,ZESTRIL) 20 MG tablet Take 20 mg by mouth daily.    Historical Provider, MD  metFORMIN (GLUCOPHAGE) 500 MG tablet Take 500 mg by mouth 2 (two) times daily with a meal.    Historical Provider, MD   Meds Ordered and Administered this Visit  Medications - No data to display  BP 138/86 (BP Location: Left Arm)   Pulse 99   Temp 97.3 F (36.3 C) (Oral)   Ht 5\' 5"  (1.651 m)   Wt 162 lb (73.5 kg)   SpO2 98%   BMI 26.96 kg/m  No data found.   Physical Exam  Constitutional: She is oriented to person, place, and time. She appears well-developed and well-nourished. No distress.  HENT:  Head: Normocephalic and atraumatic.  Eyes: EOM are normal.  Neck: Normal range of motion.  Cardiovascular: Normal rate.   Pulses:      Dorsalis pedis pulses are 2+ on the left side.  Pulmonary/Chest: Effort normal.  Musculoskeletal: She exhibits edema and tenderness. She exhibits no deformity.  Left ankle: moderate  edema, worse on Left side. Tender to lateral aspect of ankle.  Limited ROM due to pain. Full ROM toes and Left knee. Calf is soft, non-tender.  Neurological: She is alert and oriented to person, place, and time.  Skin: Skin is warm and dry. Capillary refill takes less than 2 seconds. She is not diaphoretic. No erythema.  Left ankle and foot: skin in tact, no ecchymosis or erythema.   Psychiatric: She has a normal mood and affect. Her behavior is normal.  Nursing note and vitals reviewed.   Urgent Care Course   Clinical Course    Procedures (including critical care time)  Labs Review Labs Reviewed - No data to  display  Imaging Review Dg Tibia/fibula Left  Result Date: 04/18/2016 CLINICAL DATA:  Twisted her lower leg yesterday, having proximal posterior pain and distal lateral pain EXAM: LEFT TIBIA AND FIBULA - 2 VIEW COMPARISON:  Ankle radiograph 04/18/2016 FINDINGS: AP and lateral views of the left tibia and fibula. Minimal suprapatellar effusion. A nondisplaced distal fibular shaft fracture is again visualized with out significant change in alignment. Tiny calcific densities are present on the lateral view, adjacent to the posterior cortex of the distal fibula at the site of fracture and are felt to represent tiny bone fragments. A moderate calcaneal spur is again noted. There is moderate soft tissue swelling of the left lateral leg. There is no proximal tibial or fibular fracture identified. There is mild narrowing of the medial joint space compartment with mild bony spurring. IMPRESSION: 1. Nondisplaced distal fibular fracture with tiny bone fragments along the posterior cortex of the distal fibula. 2. No evidence for proximal fibular or tibial fracture. Minimal degenerative changes. Minimal suprapatellar joint fluid. Electronically Signed   By: Donavan Foil M.D.   On: 04/18/2016 18:34   Dg Ankle Complete Left  Result Date: 04/18/2016 CLINICAL DATA:  Patient states that she twisted her left ankle last p.m. and has been having ankle pain with swelling. Unable to bear weight. EXAM: LEFT ANKLE COMPLETE - 3+ VIEW COMPARISON:  None. FINDINGS: AP oblique and lateral views of the left tibia and fibula. There is an oblique, nondisplaced fracture involving the distal diaphysis of the fibula. No significant angulation. Ankle mortise is grossly symmetric. There is soft tissue swelling of the lateral malleolus. A small to moderate calcaneal spur is identified. IMPRESSION: Nondisplaced distal fibular diaphyseal fracture. Electronically Signed   By: Donavan Foil M.D.   On: 04/18/2016 18:21     MDM   1. Left fibular  fracture, closed, initial encounter    Pt c/o Left ankle pain secondary to trip and fall yesterday. Skin in tact.  Plain films c/w nondisplaced distal fibular fracture with tiny bone fragments along posterior cortex of distal fibula.  No evidence of Proximal fibular or tibia fracture.    Ankle placed in ace wrap and cam-walker boot.   Pt states she has crutches and a walker at home. Advised she must stay off Left foot completely until f/u with Dr. Dianah Field, Sports Medicine. Encouraged to call office in the morning to schedule f/u appointment in 1 week.  Rx: Norco and ibuprofen.  Advised not to drink alcohol or drive while taking. Patient verbalized understanding and agreement with treatment plan.      Noland Fordyce, PA-C 04/19/16 1021

## 2016-04-20 ENCOUNTER — Telehealth: Payer: Self-pay | Admitting: *Deleted

## 2016-04-20 MED ORDER — HYDROCODONE-ACETAMINOPHEN 5-325 MG PO TABS: 1.0000 | ORAL_TABLET | Freq: Four times a day (QID) | ORAL | 0 refills | Status: DC | PRN

## 2016-04-20 NOTE — Telephone Encounter (Signed)
Callback: Pt reports she has made f/u apt to see Dr. Dianah Field on 04/23/16. She is staying off foot as much as possible. She is taking prescribed hydrocodone as prescribed every 6 hours and it is helping but she only has 3 left and does not see Dr. Darene Lamer for 3 days. I will inquire about a refill.

## 2016-04-21 ENCOUNTER — Telehealth: Payer: Self-pay | Admitting: *Deleted

## 2016-04-21 NOTE — Telephone Encounter (Signed)
rx handwritten from 04/20/16 phone note for hydrocodone. Pt's son Gwyndolyn Saxon Rickman to pick up rx on 04/21/16.

## 2016-04-23 ENCOUNTER — Ambulatory Visit (INDEPENDENT_AMBULATORY_CARE_PROVIDER_SITE_OTHER): Payer: BLUE CROSS/BLUE SHIELD | Admitting: Sports Medicine

## 2016-04-23 ENCOUNTER — Encounter: Payer: Self-pay | Admitting: Sports Medicine

## 2016-04-23 ENCOUNTER — Ambulatory Visit (INDEPENDENT_AMBULATORY_CARE_PROVIDER_SITE_OTHER): Payer: BLUE CROSS/BLUE SHIELD

## 2016-04-23 DIAGNOSIS — X501XXD Overexertion from prolonged static or awkward postures, subsequent encounter: Secondary | ICD-10-CM

## 2016-04-23 DIAGNOSIS — S82402A Unspecified fracture of shaft of left fibula, initial encounter for closed fracture: Secondary | ICD-10-CM

## 2016-04-23 DIAGNOSIS — S82832D Other fracture of upper and lower end of left fibula, subsequent encounter for closed fracture with routine healing: Secondary | ICD-10-CM

## 2016-04-23 MED ORDER — HYDROCODONE-ACETAMINOPHEN 10-325 MG PO TABS: 1.0000 | ORAL_TABLET | Freq: Three times a day (TID) | ORAL | 0 refills | Status: DC | PRN

## 2016-04-23 MED ORDER — AMBULATORY NON FORMULARY MEDICATION: 0 refills | Status: DC

## 2016-04-23 NOTE — Progress Notes (Signed)
   Subjective:    I'm seeing this patient as a consultation for:  Margaret Niemann PA-C  CC: Left fibular fracture  HPI: This is a pleasant 60 year old female, 4 days ago she [place] the beach, injuring her left ankle, subsequent x-ray showed a spiral fracture through the distal fibula proximal to the syndesmosis. She was placed in a boot and nonweightbearing and referred to me for further evaluation and definitive treatment. Pain is steadily improving, moderate, persistent.  Past medical history, Surgical history, Family history not pertinant except as noted below, Social history, Allergies, and medications have been entered into the medical record, reviewed, and no changes needed.   Review of Systems: No headache, visual changes, nausea, vomiting, diarrhea, constipation, dizziness, abdominal pain, skin rash, fevers, chills, night sweats, weight loss, swollen lymph nodes, body aches, joint swelling, muscle aches, chest pain, shortness of breath, mood changes, visual or auditory hallucinations.   Objective:   General: Well Developed, well nourished, and in no acute distress.  Neuro/Psych: Alert and oriented x3, extra-ocular muscles intact, able to move all 4 extremities, sensation grossly intact. Skin: Warm and dry, no rashes noted.  Respiratory: Not using accessory muscles, speaking in full sentences, trachea midline.  Cardiovascular: Pulses palpable, no extremity edema. Abdomen: Does not appear distended. Left ankle: Visibly swollen, tender over the distal fibula, neurovascularly intact distally.  X-rays personally reviewed and show an essentially nondisplaced spiral fracture through the distal fibula proximal to the syndesmosis.  Impression and Recommendations:   This case required medical decision making of moderate complexity.  Fracture of fibula, left, closed Approximately 1 week post distal fibular spiral fracture. Continue boot. Increasing hydrocodone to 10/325. Repeat x-rays  today and before the next visit, return in 2 weeks. Prescription for rolling knee scooter.  I billed a fracture code for this encounter, all subsequent visits will be post-op checks in the global period.

## 2016-04-23 NOTE — Assessment & Plan Note (Signed)
Approximately 1 week post distal fibular spiral fracture. Continue boot. Increasing hydrocodone to 10/325. Repeat x-rays today and before the next visit, return in 2 weeks. Prescription for rolling knee scooter.  I billed a fracture code for this encounter, all subsequent visits will be post-op checks in the global period.

## 2016-05-07 ENCOUNTER — Telehealth: Payer: Self-pay

## 2016-05-07 DIAGNOSIS — S82402A Unspecified fracture of shaft of left fibula, initial encounter for closed fracture: Secondary | ICD-10-CM

## 2016-05-07 MED ORDER — HYDROCODONE-ACETAMINOPHEN 10-325 MG PO TABS: 1.0000 | ORAL_TABLET | Freq: Two times a day (BID) | ORAL | 0 refills | Status: DC | PRN

## 2016-05-07 NOTE — Telephone Encounter (Signed)
Pt currently taking 1 tablet BID. Please assist.

## 2016-05-07 NOTE — Telephone Encounter (Signed)
Pt left VM asking for a refill of her Norco. Pt states she originally had an appointment today but had to reschedule and meds will not last her through the weekend. Please assist.

## 2016-05-07 NOTE — Telephone Encounter (Signed)
Pt.notified

## 2016-05-07 NOTE — Telephone Encounter (Signed)
How often is she having to take it?

## 2016-05-07 NOTE — Telephone Encounter (Signed)
Prescription in box for 10 more days.

## 2016-05-10 ENCOUNTER — Ambulatory Visit (INDEPENDENT_AMBULATORY_CARE_PROVIDER_SITE_OTHER): Payer: BLUE CROSS/BLUE SHIELD | Admitting: Sports Medicine

## 2016-05-10 ENCOUNTER — Encounter: Payer: Self-pay | Admitting: Sports Medicine

## 2016-05-10 ENCOUNTER — Ambulatory Visit (INDEPENDENT_AMBULATORY_CARE_PROVIDER_SITE_OTHER): Payer: BLUE CROSS/BLUE SHIELD

## 2016-05-10 DIAGNOSIS — X58XXXD Exposure to other specified factors, subsequent encounter: Secondary | ICD-10-CM | POA: Diagnosis not present

## 2016-05-10 DIAGNOSIS — S82442D Displaced spiral fracture of shaft of left fibula, subsequent encounter for closed fracture with routine healing: Secondary | ICD-10-CM | POA: Diagnosis not present

## 2016-05-10 DIAGNOSIS — S82402D Unspecified fracture of shaft of left fibula, subsequent encounter for closed fracture with routine healing: Secondary | ICD-10-CM

## 2016-05-10 DIAGNOSIS — S82402A Unspecified fracture of shaft of left fibula, initial encounter for closed fracture: Secondary | ICD-10-CM

## 2016-05-10 MED ORDER — HYDROCODONE-ACETAMINOPHEN 10-325 MG PO TABS: 1.0000 | ORAL_TABLET | Freq: Two times a day (BID) | ORAL | 0 refills | Status: DC | PRN

## 2016-05-10 NOTE — Progress Notes (Signed)
  Subjective:    CC: L fibula fracture follow-up  HPI: 60 yo F presenting 3 weeks s/p displaced L fibula fracture.  Since then, she has been been using a wheelchair to keep off her feet and has been wearing a walking boot on-and-off.  She says the boot is uncomfortable so hasn't been wearing it as often as she should.  She was unable to tolerate the rolling scooter because it irritated her L knee. She continues to have pain to distal fibula, which is relieved with Norco.  She is currently taking Norco when she wakes up in the morning and right before bed.  She says she has not tried to bear weight on her leg yet, but when she has accidentally put weight onto it she gets a shooting pain at the location of her fracture.    Past medical history:  Negative.  See flowsheet/record as well for more information.  Surgical history: Negative.  See flowsheet/record as well for more information.  Family history: Negative.  See flowsheet/record as well for more information.  Social history: Negative.  See flowsheet/record as well for more information.  Allergies, and medications have been entered into the medical record, reviewed, and no changes needed.   Review of Systems: No fevers, chills, night sweats, weight loss, chest pain, or shortness of breath.   Objective:    General: Well Developed, well nourished, and in no acute distress.  Neuro: Alert and oriented x3, extra-ocular muscles intact, sensation grossly intact.  HEENT: Normocephalic, atraumatic, pupils equal round reactive to light, neck supple, no masses, no lymphadenopathy, thyroid nonpalpable.  Skin: Warm and dry, no rashes. Cardiac: Regular rate and rhythm, no murmurs rubs or gallops, no lower extremity edema.  Respiratory: Clear to auscultation bilaterally. Not using accessory muscles, speaking in full sentences. L leg/ankle: Swelling and erythema near lateral mallelous, improved from three weeks ago.  TTP three cm above distal fibula.    Decreased ROM.  Unable to walk 4 steps.    Impression and Recommendations:    1. Left fibula fracture - X-rays today show increased displacement of fracture.  She has not been compliant with wearing the walking boot.  Norco has been helping with pain. -Short-leg cast for 1 week -Refill Norco prescription  -Return in 1 week to remove cast and repeat X-rays

## 2016-05-10 NOTE — Assessment & Plan Note (Signed)
3 weeks post fracture, there has been some slightly increased displacement, minimal. Short-leg cast placed, she had questionable compliance in the boot. Return to see me in one week for consideration of cast removal, we will repeat x-rays at that time when the cast has been cut off.

## 2016-05-17 ENCOUNTER — Ambulatory Visit (INDEPENDENT_AMBULATORY_CARE_PROVIDER_SITE_OTHER): Payer: BLUE CROSS/BLUE SHIELD | Admitting: Sports Medicine

## 2016-05-17 DIAGNOSIS — S82442D Displaced spiral fracture of shaft of left fibula, subsequent encounter for closed fracture with routine healing: Secondary | ICD-10-CM

## 2016-05-17 NOTE — Progress Notes (Signed)
  Subjective:  1 month post distal fibular spiral fracture, overall doing well, cast is to be removed today.  Objective: General: Well-developed, well-nourished, and in no acute distress. Left leg: Cast is removed, really nontender at this point over the fracture.  Assessment/plan:   Fracture of fibula, left, closed 4 weeks post fracture, one week in the cast, 3 weeks prior in the boot with questionable compliance. Cast is removed today and she has no tenderness over the fracture. She will return to the boot for another week with weightbearing as tolerated, and then afterwards will wear regular footwear. Return to see me in one month.

## 2016-05-17 NOTE — Assessment & Plan Note (Signed)
4 weeks post fracture, one week in the cast, 3 weeks prior in the boot with questionable compliance. Cast is removed today and she has no tenderness over the fracture. She will return to the boot for another week with weightbearing as tolerated, and then afterwards will wear regular footwear. Return to see me in one month.

## 2016-05-25 ENCOUNTER — Telehealth: Payer: Self-pay

## 2016-05-25 NOTE — Telephone Encounter (Signed)
Pt called wanting to when should she can  expect a call back because she's in a lot of pain and down to 1 pill.

## 2016-05-25 NOTE — Telephone Encounter (Signed)
Pt would like a refill of hydrocodone. States she's been in pain since she's up and walking. Please assist.

## 2016-05-27 ENCOUNTER — Other Ambulatory Visit: Payer: Self-pay | Admitting: Sports Medicine

## 2016-05-27 ENCOUNTER — Encounter: Payer: Self-pay | Admitting: Sports Medicine

## 2016-05-27 MED ORDER — HYDROCODONE-ACETAMINOPHEN 5-325 MG PO TABS: 1.0000 | ORAL_TABLET | Freq: Three times a day (TID) | ORAL | 0 refills | Status: DC | PRN

## 2016-05-27 NOTE — Telephone Encounter (Signed)
I will give her a single additional 20 pills, this is her last fill, the fracture is essentially healed.

## 2016-05-27 NOTE — Addendum Note (Signed)
Addended by: Silverio Decamp on: 05/27/2016 11:06 AM   Modules accepted: Orders

## 2016-05-27 NOTE — Telephone Encounter (Signed)
Notified. 

## 2016-05-28 ENCOUNTER — Encounter: Payer: Self-pay | Admitting: Sports Medicine

## 2016-05-28 DIAGNOSIS — S82302K Unspecified fracture of lower end of left tibia, subsequent encounter for closed fracture with nonunion: Secondary | ICD-10-CM

## 2016-05-28 DIAGNOSIS — S82832K Other fracture of upper and lower end of left fibula, subsequent encounter for closed fracture with nonunion: Principal | ICD-10-CM

## 2016-05-31 ENCOUNTER — Encounter: Payer: Self-pay | Admitting: Sports Medicine

## 2016-06-01 ENCOUNTER — Ambulatory Visit (INDEPENDENT_AMBULATORY_CARE_PROVIDER_SITE_OTHER): Payer: BLUE CROSS/BLUE SHIELD

## 2016-06-01 DIAGNOSIS — X501XXD Overexertion from prolonged static or awkward postures, subsequent encounter: Secondary | ICD-10-CM | POA: Diagnosis not present

## 2016-06-01 DIAGNOSIS — S82832K Other fracture of upper and lower end of left fibula, subsequent encounter for closed fracture with nonunion: Principal | ICD-10-CM

## 2016-06-01 DIAGNOSIS — S82452K Displaced comminuted fracture of shaft of left fibula, subsequent encounter for closed fracture with nonunion: Secondary | ICD-10-CM | POA: Diagnosis not present

## 2016-06-01 DIAGNOSIS — S82302K Unspecified fracture of lower end of left tibia, subsequent encounter for closed fracture with nonunion: Secondary | ICD-10-CM

## 2016-06-03 ENCOUNTER — Encounter: Payer: Self-pay | Admitting: Sports Medicine

## 2016-06-03 ENCOUNTER — Ambulatory Visit (INDEPENDENT_AMBULATORY_CARE_PROVIDER_SITE_OTHER): Payer: BLUE CROSS/BLUE SHIELD | Admitting: Sports Medicine

## 2016-06-03 DIAGNOSIS — S82442G Displaced spiral fracture of shaft of left fibula, subsequent encounter for closed fracture with delayed healing: Secondary | ICD-10-CM

## 2016-06-03 MED ORDER — TRAMADOL HCL 50 MG PO TABS: ORAL_TABLET | ORAL | 0 refills | Status: DC

## 2016-06-03 NOTE — Progress Notes (Signed)
  Subjective: Margaret Mcgee returns she is 6 weeks post distal fibular spiral fracture, still with some pain but doing better.   Objective: General: Well-developed, well-nourished, and in no acute distress. Left ankle: Still with minimal tenderness over the fracture, there is palpable bony callus, neurovascularly intact distally.  CT was personally reviewed with the patient, there is good bony callus at the proximal aspect of the spiral fracture, she still has incomplete union distally.  Assessment/plan:   Fracture of fibula, left, closed I don't think this is not Or malunion, but simply delayed union, she does have good union and good bony callus at the proximal fibular spiral fracture. I simply think we need to give it more time. Decreasing to tramadol for up to 1 pill daily.

## 2016-06-03 NOTE — Assessment & Plan Note (Signed)
I don't think this is not Or malunion, but simply delayed union, she does have good union and good bony callus at the proximal fibular spiral fracture. I simply think we need to give it more time. Decreasing to tramadol for up to 1 pill daily.

## 2016-07-15 ENCOUNTER — Encounter: Payer: Self-pay | Admitting: Sports Medicine

## 2016-07-15 ENCOUNTER — Ambulatory Visit (INDEPENDENT_AMBULATORY_CARE_PROVIDER_SITE_OTHER): Payer: BLUE CROSS/BLUE SHIELD | Admitting: Sports Medicine

## 2016-07-15 DIAGNOSIS — S82442G Displaced spiral fracture of shaft of left fibula, subsequent encounter for closed fracture with delayed healing: Secondary | ICD-10-CM

## 2016-07-15 NOTE — Assessment & Plan Note (Signed)
This is the three-month mark, overall she is pain-free.

## 2016-07-15 NOTE — Progress Notes (Signed)
  Subjective:    CC: Follow-up  HPI: This is a pleasant 60 year old female with a spiral left fibular fracture, she had some delayed union but ultimately she is now pain-free.  Past medical history:  Negative.  See flowsheet/record as well for more information.  Surgical history: Negative.  See flowsheet/record as well for more information.  Family history: Negative.  See flowsheet/record as well for more information.  Social history: Negative.  See flowsheet/record as well for more information.  Allergies, and medications have been entered into the medical record, reviewed, and no changes needed.   Review of Systems: No fevers, chills, night sweats, weight loss, chest pain, or shortness of breath.   Objective:    General: Well Developed, well nourished, and in no acute distress.  Neuro: Alert and oriented x3, extra-ocular muscles intact, sensation grossly intact.  HEENT: Normocephalic, atraumatic, pupils equal round reactive to light, neck supple, no masses, no lymphadenopathy, thyroid nonpalpable.  Skin: Warm and dry, no rashes. Cardiac: Regular rate and rhythm, no murmurs rubs or gallops, no lower extremity edema.  Respiratory: Clear to auscultation bilaterally. Not using accessory muscles, speaking in full sentences. Left Ankle: No visible erythema or swelling. Range of motion is full in all directions. Strength is 5/5 in all directions. Stable lateral and medial ligaments; squeeze test and kleiger test unremarkable; Talar dome nontender; Palpable bony callus over the lateral fibula No pain at base of 5th MT; No tenderness over cuboid; No tenderness over N spot or navicular prominence No tenderness on posterior aspects of lateral and medial malleolus No sign of peroneal tendon subluxations; Negative tarsal tunnel tinel's Able to walk 4 steps.  Impression and Recommendations:    Fracture of fibula, left, closed This is the three-month mark, overall she is  pain-free.

## 2017-05-09 ENCOUNTER — Encounter: Payer: Self-pay | Admitting: Emergency Medicine

## 2017-05-09 ENCOUNTER — Emergency Department
Admission: EM | Admit: 2017-05-09 | Discharge: 2017-05-09 | Disposition: A | Discharge status: Home / Self Care | Payer: BLUE CROSS/BLUE SHIELD | Source: Home / Self Care | Attending: Family Medicine | Admitting: Family Medicine

## 2017-05-09 DIAGNOSIS — R062 Wheezing: Secondary | ICD-10-CM

## 2017-05-09 DIAGNOSIS — J209 Acute bronchitis, unspecified: Secondary | ICD-10-CM | POA: Diagnosis not present

## 2017-05-09 MED ORDER — ALBUTEROL SULFATE HFA 108 (90 BASE) MCG/ACT IN AERS: 1.0000 | INHALATION_SPRAY | Freq: Four times a day (QID) | RESPIRATORY_TRACT | 0 refills | Status: DC | PRN

## 2017-05-09 MED ORDER — PREDNISONE 20 MG PO TABS: ORAL_TABLET | ORAL | 0 refills | Status: DC

## 2017-05-09 MED ORDER — AZITHROMYCIN 250 MG PO TABS: 250.0000 mg | ORAL_TABLET | Freq: Every day | ORAL | 0 refills | Status: DC

## 2017-05-09 NOTE — ED Triage Notes (Signed)
Cough x 10 days, slight sore throat

## 2017-05-09 NOTE — Discharge Instructions (Signed)
°  You may take 500mg acetaminophen every 4-6 hours or in combination with ibuprofen 400-600mg every 6-8 hours as needed for pain, inflammation, and fever. ° °Be sure to drink at least eight 8oz glasses of water to stay well hydrated and get at least 8 hours of sleep at night, preferably more while sick.  ° °

## 2017-05-09 NOTE — ED Provider Notes (Signed)
[name]    CSN: [id number] Arrival date & time: 05/09/17  1707     History   Chief Complaint Chief Complaint  Patient presents with  . Cough    HPI Margaret Mcgee is a 61 y.o. female.   HPI  Margaret Mcgee is a 61 y.o. female presenting to UC with c/o 10 days of worsening mildly productive hacking cough, associated wheeze and sore throat. Denies fever, chills, n/v/d. She has been using her albuterol inhaler, mucinex, and leftover Tessalon pearls with mild relief. Her husband was sick last week and was seen in UC at that time. No recent travel.    Past Medical History:  Diagnosis Date  . Anxiety   . Depression   . Diabetes mellitus without complication ([place])   . Hypercholesteremia   . Hypertension     Patient Active Problem List   Diagnosis Date Noted  . Fracture of fibula, left, closed 04/23/2016    Past Surgical History:  Procedure Laterality Date  . ANAL FISSURE REPAIR    . CESAREAN SECTION    . TONSILLECTOMY      OB History    No data available       Home Medications    Prior to Admission medications   Medication Sig Start Date End Date Taking? Authorizing Provider  albuterol (PROVENTIL HFA;VENTOLIN HFA) 108 (90 Base) MCG/ACT inhaler Inhale 1-2 puffs into the lungs every 6 (six) hours as needed for wheezing or shortness of breath. 05/09/17   Noe Gens, PA-C  ALPRAZolam Duanne Moron) 1 MG tablet Take 1 mg by mouth at bedtime as needed.    [provider]  amitriptyline (ELAVIL) 150 MG tablet Take 150 mg by mouth at bedtime.    [provider]  azithromycin (ZITHROMAX) 250 MG tablet Take 1 tablet (250 mg total) by mouth daily. Take first 2 tablets together, then 1 every day until finished. 05/09/17   Noe Gens, PA-C  canagliflozin (INVOKANA) 300 MG TABS tablet Take 300 mg by mouth daily before breakfast.    [provider]  ibuprofen (ADVIL,MOTRIN) 600 MG tablet Take 1 tablet (600 mg total) by mouth every 6  (six) hours as needed. 04/18/16   Noe Gens, PA-C  lisinopril (PRINIVIL,ZESTRIL) 20 MG tablet Take 20 mg by mouth daily.    [provider]  metFORMIN (GLUCOPHAGE) 500 MG tablet Take 500 mg by mouth 2 (two) times daily with a meal.    [provider]  predniSONE (DELTASONE) 20 MG tablet 3 tabs po day one, then 2 po daily x 4 days 05/09/17   Noe Gens, PA-C  traMADol Veatrice Bourbon) 50 MG tablet 1 tab by mouth daily as needed 06/03/16   Silverio Decamp, MD    Family History Family History  Problem Relation Age of Onset  . Diabetes Mother     Social History Social History  Substance Use Topics  . Smoking status: Former Smoker    Quit date: 05/10/2011  . Smokeless tobacco: Never Used  . Alcohol use Yes     Allergies   Penicillins and Sulfa antibiotics   Review of Systems Review of Systems  Constitutional: Negative for chills and fever.  HENT: Positive for congestion, postnasal drip and sore throat. Negative for ear pain, trouble swallowing and voice change.   Respiratory: Positive for cough and wheezing. Negative for shortness of breath.   Cardiovascular: Negative for chest pain and palpitations.  Gastrointestinal: Negative for abdominal pain, diarrhea, nausea and vomiting.  Musculoskeletal: Negative for arthralgias, back pain and myalgias.  Skin: Negative for rash.     Physical Exam Triage Vital Signs ED Triage Vitals [05/09/17 1724]  Enc Vitals Group     BP 138/86     Pulse Rate 100     Resp      Temp 99 F (37.2 C)     Temp Source Oral     SpO2 97 %     Weight 169 lb (76.7 kg)     Height 5\' 5"  (1.651 m)     Head Circumference      Peak Flow      Pain Score 1     Pain Loc      Pain Edu?      Excl. in [place]?    No data found.   Updated Vital Signs BP 138/86 (BP Location: Left Arm)   Pulse 100   Temp 99 F (37.2 C) (Oral)   Ht 5\' 5"  (1.651 m)   Wt 169 lb (76.7 kg)   SpO2 97%   BMI 28.12 kg/m   Visual Acuity Right Eye  Distance:   Left Eye Distance:   Bilateral Distance:    Right Eye Near:   Left Eye Near:    Bilateral Near:     Physical Exam  Constitutional: She is oriented to person, place, and time. She appears well-developed and well-nourished. No distress.  HENT:  Head: Normocephalic and atraumatic.  Right Ear: Tympanic membrane normal.  Left Ear: Tympanic membrane normal.  Nose: Mucosal edema present. Right sinus exhibits no maxillary sinus tenderness and no frontal sinus tenderness. Left sinus exhibits no maxillary sinus tenderness and no frontal sinus tenderness.  Mouth/Throat: Uvula is midline, oropharynx is clear and moist and mucous membranes are normal.  Eyes: EOM are normal.  Neck: Normal range of motion. Neck supple.  Cardiovascular: Normal rate and regular rhythm.   Pulmonary/Chest: Effort normal. No respiratory distress. She has wheezes. She has no rales.  Musculoskeletal: Normal range of motion.  Neurological: She is alert and oriented to person, place, and time.  Skin: Skin is warm and dry. She is not diaphoretic.  Psychiatric: She has a normal mood and affect. Her behavior is normal.  Nursing note and vitals reviewed.    UC Treatments / Results  Labs (all labs ordered are listed, but only abnormal results are displayed) Labs Reviewed - No data to display  EKG  EKG Interpretation None       Radiology No results found.  Procedures Procedures (including critical care time)  Medications Ordered in UC Medications - No data to display   Initial Impression / Assessment and Plan / UC Course  I have reviewed the triage vital signs and the nursing notes.  Pertinent labs & imaging results that were available during my care of the patient were reviewed by me and considered in my medical decision making (see chart for details).     Pt c/o 10 days of worsening cough with wheeze.  Slight wheeze in RLL field noted on exam. Will cover for atypical bacteria Pt has done  well on prednisone in the past for cough as well.   Final Clinical Impressions(s) / UC Diagnoses   Final diagnoses:  Acute bronchitis, unspecified organism  Wheeze    New Prescriptions Discharge Medication List as of 05/09/2017  5:36 PM    START taking these medications   Details  albuterol (PROVENTIL HFA;VENTOLIN HFA) 108 (90 Base) MCG/ACT inhaler Inhale 1-2 puffs into the  lungs every 6 (six) hours as needed for wheezing or shortness of breath., Starting Mon 05/09/2017, Normal    azithromycin (ZITHROMAX) 250 MG tablet Take 1 tablet (250 mg total) by mouth daily. Take first 2 tablets together, then 1 every day until finished., Starting Mon 05/09/2017, Normal    predniSONE (DELTASONE) 20 MG tablet 3 tabs po day one, then 2 po daily x 4 days, Normal         Controlled Substance Prescriptions [place] Controlled Substance Registry consulted? Not Applicable   Tyrell Antonio 05/09/17 1749

## 2017-08-04 ENCOUNTER — Other Ambulatory Visit: Payer: Self-pay

## 2017-08-04 ENCOUNTER — Encounter: Payer: Self-pay | Admitting: Emergency Medicine

## 2017-08-04 ENCOUNTER — Emergency Department
Admission: EM | Admit: 2017-08-04 | Discharge: 2017-08-04 | Disposition: A | Discharge status: Home / Self Care | Payer: BLUE CROSS/BLUE SHIELD | Source: Home / Self Care | Attending: Family Medicine | Admitting: Family Medicine

## 2017-08-04 ENCOUNTER — Emergency Department (INDEPENDENT_AMBULATORY_CARE_PROVIDER_SITE_OTHER): Payer: BLUE CROSS/BLUE SHIELD

## 2017-08-04 DIAGNOSIS — R05 Cough: Secondary | ICD-10-CM | POA: Diagnosis not present

## 2017-08-04 DIAGNOSIS — B9789 Other viral agents as the cause of diseases classified elsewhere: Secondary | ICD-10-CM

## 2017-08-04 DIAGNOSIS — J9801 Acute bronchospasm: Secondary | ICD-10-CM

## 2017-08-04 DIAGNOSIS — R062 Wheezing: Secondary | ICD-10-CM

## 2017-08-04 DIAGNOSIS — J069 Acute upper respiratory infection, unspecified: Secondary | ICD-10-CM

## 2017-08-04 MED ORDER — FLUTICASONE-SALMETEROL 100-50 MCG/DOSE IN AEPB: 1.0000 | INHALATION_SPRAY | Freq: Two times a day (BID) | RESPIRATORY_TRACT | 1 refills | Status: DC

## 2017-08-04 MED ORDER — PREDNISONE 20 MG PO TABS: ORAL_TABLET | ORAL | 0 refills | Status: DC

## 2017-08-04 MED ORDER — AZITHROMYCIN 250 MG PO TABS: ORAL_TABLET | ORAL | 0 refills | Status: DC

## 2017-08-04 NOTE — Discharge Instructions (Signed)
Take plain guaifenesin (1200mg  extended release tabs such as Mucinex) twice daily, with plenty of water, for cough and congestion.    May use Afrin nasal spray (or generic oxymetazoline) each morning for about 5 days and then discontinue.  Also recommend using saline nasal spray several times daily and saline nasal irrigation (AYR is a common brand).  Use Flonase nasal spray each morning after using Afrin nasal spray and saline nasal irrigation. Try warm salt water gargles for sore throat.  Stop all antihistamines for now, and other non-prescription cough/cold preparations. May continue albuterol inhaler as needed. May take Delsym Cough Suppressant at bedtime for nighttime cough.  Begin Azithromycin if not improving about one week or if persistent fever develops   Follow-up with family doctor if not improving about 10 days.

## 2017-08-04 NOTE — ED Triage Notes (Signed)
Productive cough x 2.5 weeks

## 2017-08-04 NOTE — ED Provider Notes (Signed)
[name]    CSN: [id number] Arrival date & time: 08/04/17  1639     History   Chief Complaint Chief Complaint  Patient presents with  . Cough    HPI Margaret Mcgee is a 61 y.o. female.   Patient complains of a persistent cough for about 2.5 weeks with frequent wheezing improved with her albuterol inhaler.  During the past 2 to 3 days she has had a flare-up of right lower back pain, increased fatigue, and increased cough.  No urinary symptoms.  No fevers, chills, and sweats. 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   The history is provided by the patient and the spouse.    Past Medical History:  Diagnosis Date  . Anxiety   . Depression   . Diabetes mellitus without complication ([place])   . Hypercholesteremia   . Hypertension     Patient Active Problem List   Diagnosis Date Noted  . Fracture of fibula, left, closed 04/23/2016    Past Surgical History:  Procedure Laterality Date  . ANAL FISSURE REPAIR    . CESAREAN SECTION    . TONSILLECTOMY      OB History    No data available       Home Medications    Prior to Admission medications   Medication Sig Start Date End Date Taking? Authorizing Provider  albuterol (PROVENTIL HFA;VENTOLIN HFA) 108 (90 Base) MCG/ACT inhaler Inhale 1-2 puffs into the lungs every 6 (six) hours as needed for wheezing or shortness of breath. 05/09/17   Noe Gens, PA-C  ALPRAZolam Duanne Moron) 1 MG tablet Take 1 mg by mouth at bedtime as needed.    [provider]  amitriptyline (ELAVIL) 150 MG tablet Take 150 mg by mouth at bedtime.    [provider]  azithromycin (ZITHROMAX Z-PAK) 250 MG tablet Take 2 tabs today; then begin one tab once daily for 4 more days. (Rx void after 08/12/17) 08/04/17   Kandra Nicolas, MD  canagliflozin (INVOKANA) 300 MG TABS tablet Take 300 mg by mouth daily before breakfast.    [provider]  Fluticasone-Salmeterol (ADVAIR  DISKUS) 100-50 MCG/DOSE AEPB Inhale 1 puff into the lungs 2 (two) times daily. 08/04/17   Kandra Nicolas, MD  ibuprofen (ADVIL,MOTRIN) 600 MG tablet Take 1 tablet (600 mg total) by mouth every 6 (six) hours as needed. 04/18/16   Noe Gens, PA-C  lisinopril (PRINIVIL,ZESTRIL) 20 MG tablet Take 20 mg by mouth daily.    [provider]  metFORMIN (GLUCOPHAGE) 500 MG tablet Take 500 mg by mouth 2 (two) times daily with a meal.    [provider]  predniSONE (DELTASONE) 20 MG tablet Take one tab by mouth twice daily for 4 days, then one daily. Take with food. 08/04/17   Kandra Nicolas, MD    Family History Family History  Problem Relation Age of Onset  . Diabetes Mother     Social History Social History   Tobacco Use  . Smoking status: Former Smoker    Last attempt to quit: 05/10/2011    Years since quitting: 6.2  . Smokeless tobacco: Never Used  Substance Use Topics  . Alcohol use: Yes  . Drug use: No     Allergies   Penicillins and Sulfa antibiotics   Review of Systems Review of Systems No sore throat + cough No pleuritic pain + wheezing + nasal congestion + post-nasal drainage No sinus  pain/pressure No itchy/red eyes No earache No hemoptysis + SOB No fever/chills No nausea No vomiting No abdominal pain No diarrhea No urinary symptoms No skin rash + fatigue No myalgias + headache Used OTC meds without relief   Physical Exam Triage Vital Signs ED Triage Vitals  Enc Vitals Group     BP 08/04/17 1726 (!) 159/98     Pulse Rate 08/04/17 1726 (!) 102     Resp --      Temp 08/04/17 1726 98.5 F (36.9 C)     Temp Source 08/04/17 1726 Oral     SpO2 08/04/17 1726 99 %     Weight 08/04/17 1727 172 lb (78 kg)     Height 08/04/17 1727 5\' 5"  (1.651 m)     Head Circumference --      Peak Flow --      Pain Score 08/04/17 1727 0     Pain Loc --      Pain Edu? --      Excl. in [place]? --    No data found.  Updated Vital Signs BP (!)  159/98 (BP Location: Right Arm)   Pulse (!) 102   Temp 98.5 F (36.9 C) (Oral)   Ht 5\' 5"  (1.651 m)   Wt 172 lb (78 kg)   SpO2 99%   BMI 28.62 kg/m   Visual Acuity Right Eye Distance:   Left Eye Distance:   Bilateral Distance:    Right Eye Near:   Left Eye Near:    Bilateral Near:     Physical Exam Nursing notes and Vital Signs reviewed. Appearance:  Patient appears stated age, and in no acute distress Eyes:  Pupils are equal, round, and reactive to light and accomodation.  Extraocular movement is intact.  Conjunctivae are not inflamed  Ears:  Canals normal.  Tympanic membranes normal.  Nose:  Mildly congested turbinates.  No sinus tenderness.    Pharynx:  Normal Neck:  Supple.  Enlarged posterior/lateral nodes are palpated bilaterally, tender to palpation on the left.   Lungs:   Faint expiratory wheezes heard posterior chest.  Breath sounds are equal.  Moving air well. Heart:  Regular rate and rhythm without murmurs, rubs, or gallops.  Abdomen:  Nontender without masses or hepatosplenomegaly.  Bowel sounds are present.  No CVA or flank tenderness.  Extremities:  No edema.  Skin:  No rash present.    UC Treatments / Results  Labs (all labs ordered are listed, but only abnormal results are displayed) Labs Reviewed - No data to display  EKG  EKG Interpretation None       Radiology Dg Chest 2 View  Result Date: 08/04/2017 CLINICAL DATA:  Cough, wheezing x2 weeks EXAM: CHEST  2 VIEW COMPARISON:  04/23/2015 FINDINGS: Lungs are clear.  No pleural effusion or pneumothorax. The heart is normal in size. Mild degenerative changes of the visualized thoracolumbar spine. IMPRESSION: Normal chest radiographs. Electronically Signed   By: Julian Hy M.D.   On: 08/04/2017 19:05    Procedures Procedures (including critical care time)  Medications Ordered in UC Medications - No data to display   Initial Impression / Assessment and Plan / UC Course  I have reviewed the  triage vital signs and the nursing notes.  Pertinent labs & imaging results that were available during my care of the patient were reviewed by me and considered in my medical decision making (see chart for details).    Begin Advair. Begin prednisone burst/taper. Take plain  guaifenesin (1200mg  extended release tabs such as Mucinex) twice daily, with plenty of water, for cough and congestion.    May use Afrin nasal spray (or generic oxymetazoline) each morning for about 5 days and then discontinue.  Also recommend using saline nasal spray several times daily and saline nasal irrigation (AYR is a common brand).  Use Flonase nasal spray each morning after using Afrin nasal spray and saline nasal irrigation. Try warm salt water gargles for sore throat.  Stop all antihistamines for now, and other non-prescription cough/cold preparations. May continue albuterol inhaler as needed. May take Delsym Cough Suppressant at bedtime for nighttime cough.  Begin Azithromycin if not improving about one week or if persistent fever develops (Given a prescription to hold, with an expiration date)  Follow-up with family doctor if not improving about 10 days.     Final Clinical Impressions(s) / UC Diagnoses   Final diagnoses:  Viral URI with cough  Bronchospasm, acute    ED Discharge Orders        Ordered    predniSONE (DELTASONE) 20 MG tablet     08/04/17 1942    Fluticasone-Salmeterol (ADVAIR DISKUS) 100-50 MCG/DOSE AEPB  2 times daily     08/04/17 1942    azithromycin (ZITHROMAX Z-PAK) 250 MG tablet     08/04/17 1943           Kandra Nicolas, MD 08/06/17 1807

## 2018-01-19 IMAGING — DX DG ANKLE COMPLETE 3+V*L*
3 series · 3 of 3 positions shown · non-contrast
Comparison: Left ankle series of April 18, 2016

CLINICAL DATA: Follow-up left ankle fracture.

EXAM:
LEFT ANKLE COMPLETE - 3+ VIEW

[ankle ap]
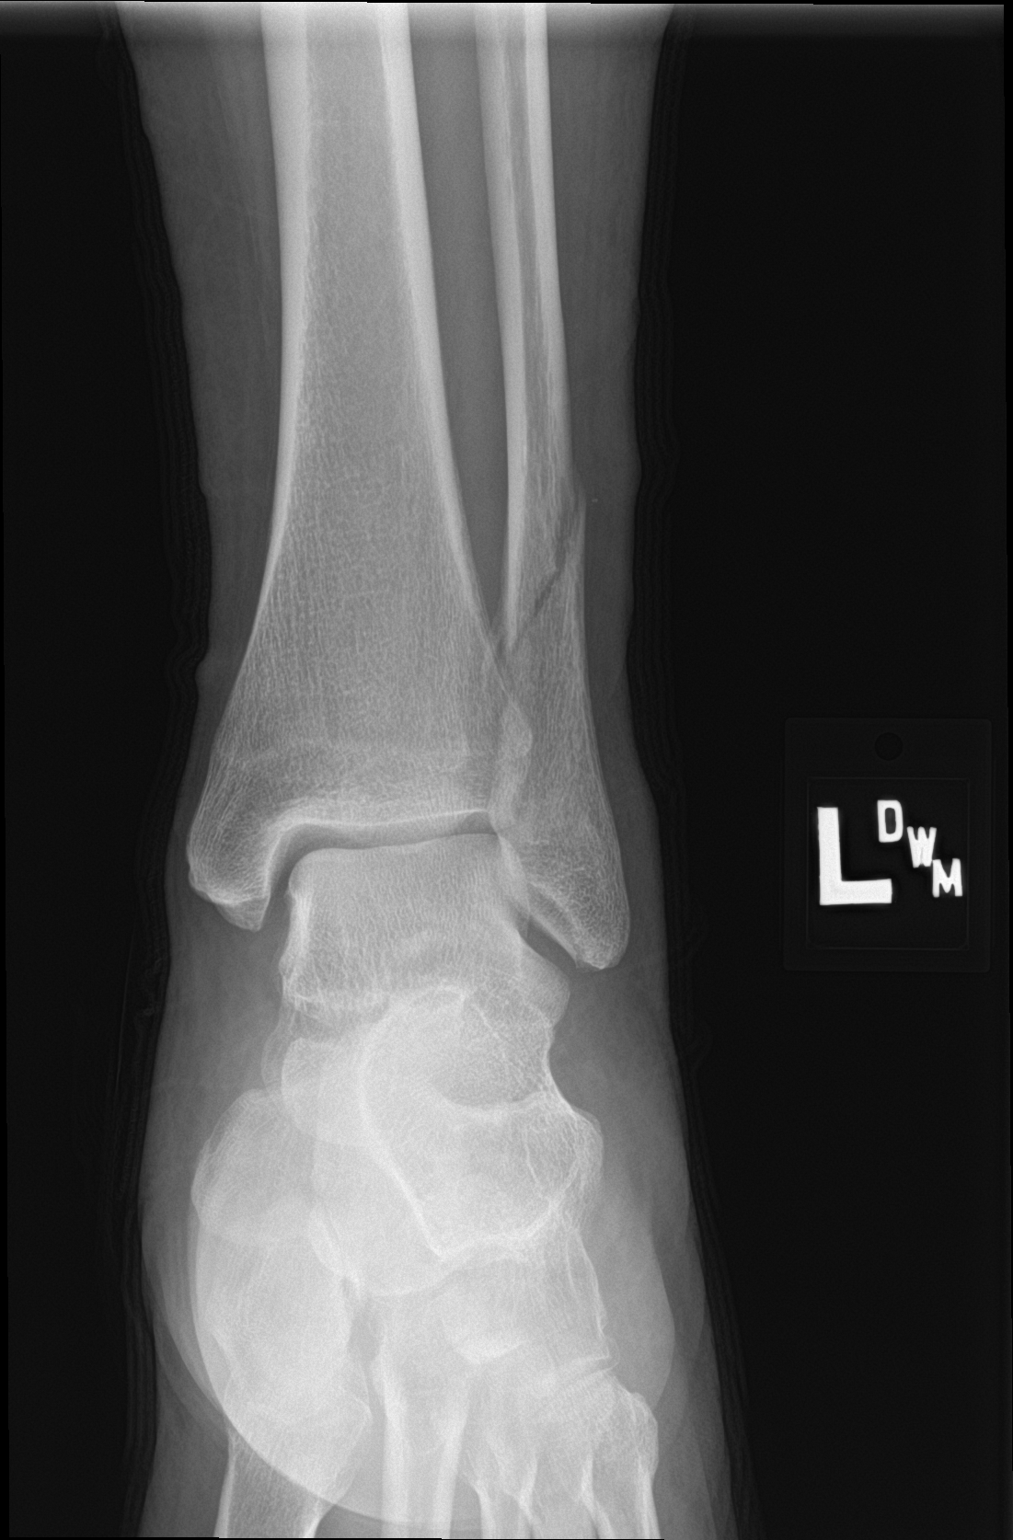

[ankle obl]
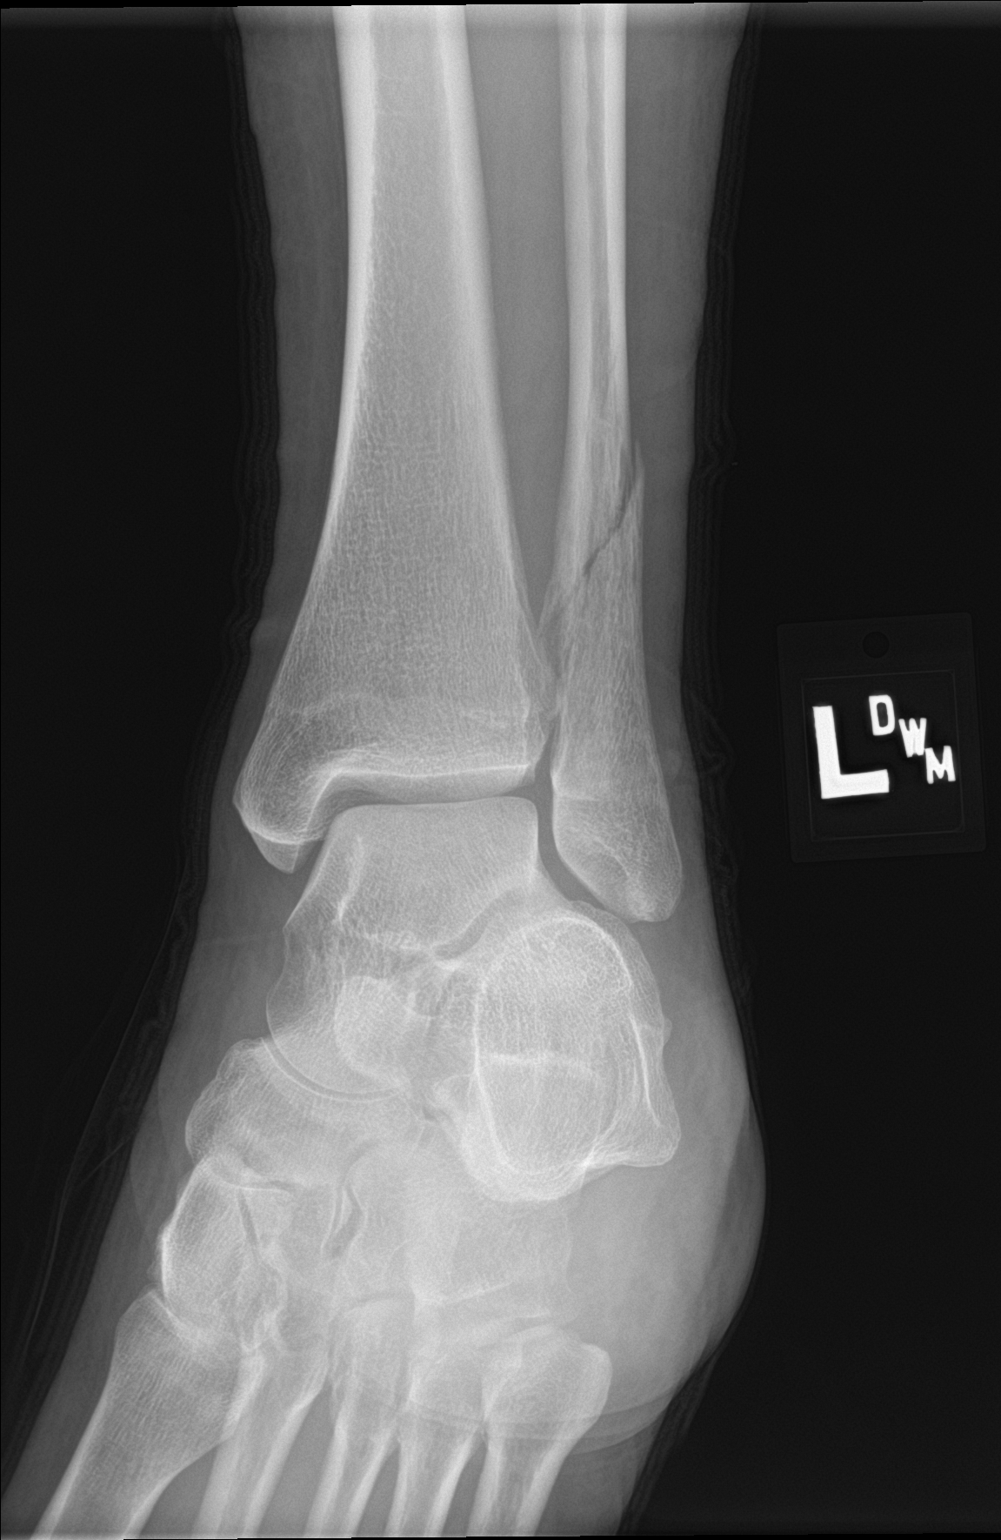

[ankle lat]
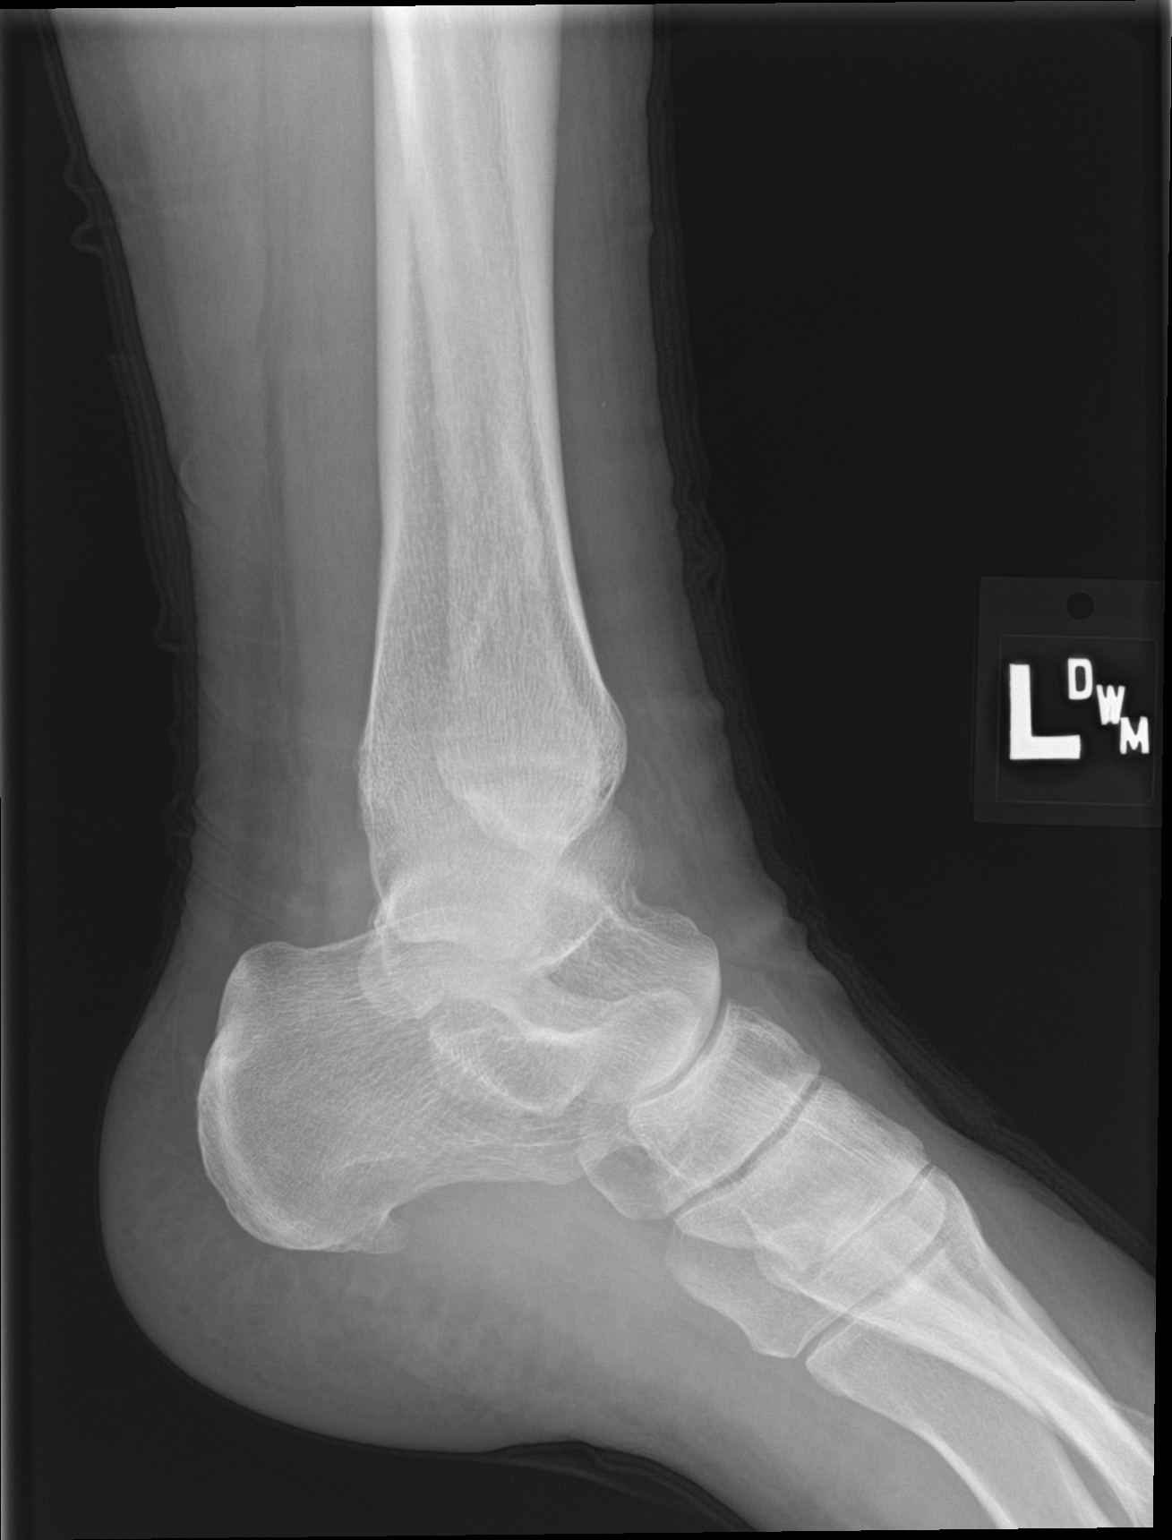

[3 of 3 positions shown; findings below may reference images not displayed]

FINDINGS: The mildly displaced distal fibular shaft fracture is again
demonstrated. It has not significantly changed in position. The
fracture line margins are slightly less distinct consistent with
ongoing bony resorption and healing. No significant periosteal
reaction is observed. The adjacent tibia is intact. There is a
plantar calcaneal spur. Otherwise the calcaneus and talus are
normal. The joint mortise is preserved.
IMPRESSION: No significant change in the appearance of the spiral fracture the
distal fibular shaft over the past 5 days.

## 2018-02-17 ENCOUNTER — Telehealth: Payer: Self-pay

## 2018-02-17 NOTE — Telephone Encounter (Signed)
Pt called asking about ear lavage. Said she saw PCP who informed her she had earwax buildup. Nurse at [facility] did ear lavage and wasn't successful. Was wondering if she should come here or see an ENT. I advised pt she is welcome to be seen however no guarentee we could be successful. Pt decided due to insurance, itd be better to see ENT. Gave pt our hours in case she changes her mind.

## 2021-11-25 ENCOUNTER — Emergency Department
Admission: EM | Admit: 2021-11-25 | Discharge: 2021-11-25 | Disposition: A | Discharge status: Home / Self Care | Payer: 59 | Source: Home / Self Care | Attending: Family Medicine | Admitting: Family Medicine

## 2021-11-25 DIAGNOSIS — R6883 Chills (without fever): Secondary | ICD-10-CM | POA: Diagnosis not present

## 2021-11-25 DIAGNOSIS — R8290 Unspecified abnormal findings in urine: Secondary | ICD-10-CM

## 2021-11-25 LAB — POCT URINALYSIS DIP (MANUAL ENTRY)
Bilirubin, UA: NEGATIVE
Blood, UA: NEGATIVE
Glucose, UA: NEGATIVE mg/dL
Nitrite, UA: NEGATIVE
Protein Ur, POC: NEGATIVE mg/dL
Spec Grav, UA: 1.015 (ref 1.010–1.025)
Urobilinogen, UA: 0.2 E.U./dL
pH, UA: 6 (ref 5.0–8.0)

## 2021-11-25 MED ORDER — NITROFURANTOIN MONOHYD MACRO 100 MG PO CAPS: 100.0000 mg | ORAL_CAPSULE | Freq: Two times a day (BID) | ORAL | 0 refills | Status: DC

## 2021-11-25 NOTE — ED Triage Notes (Signed)
Pt states that she has some chills, discolored urine and foam in her urine. X2 weeks ? ? ? ?

## 2021-11-25 NOTE — ED Provider Notes (Signed)
?[facility] ? ? ? ?CSN: [id number] ?Arrival date & time: 11/25/21  1617 ? ? ?  ? ?History   ?Chief Complaint ?Chief Complaint  ?Patient presents with  ? Chills  ?  Chills, discolored urine, and foam in her urine. X2 weeks  ? ? ?HPI ?Margaret Mcgee is a 66 y.o. female.  ? ?HPI ?Patient states that she has had dysuria and frequency off and on for a couple of weeks.  The last couple of nights she has had some chills.  She thinks she is coming down with a urinary tract infection.  She has had these before, but not recurrent ?Past Medical History:  ?Diagnosis Date  ? Anxiety   ? Depression   ? Diabetes mellitus without complication ([place])   ? Hypercholesteremia   ? Hypertension   ? ? ?Patient Active Problem List  ? Diagnosis Date Noted  ? Fracture of fibula, left, closed 04/23/2016  ? ? ?Past Surgical History:  ?Procedure Laterality Date  ? ANAL FISSURE REPAIR    ? CESAREAN SECTION    ? TONSILLECTOMY    ? ? ?OB History   ?No obstetric history on file. ?  ? ? ? ?Home Medications   ? ?Prior to Admission medications   ?Medication Sig Start Date End Date Taking? Authorizing Provider  ?amitriptyline (ELAVIL) 75 MG tablet Take by mouth. 09/30/15  Yes [provider]  ?fluticasone (FLONASE) 50 MCG/ACT nasal spray SPRAY 2 SPRAYS INTO EACH NOSTRIL EVERY DAY 03/22/16  Yes [provider]  ?metFORMIN (GLUCOPHAGE) 500 MG tablet Take 500 mg by mouth 2 (two) times daily with a meal.   Yes [provider]  ?nitrofurantoin, macrocrystal-monohydrate, (MACROBID) 100 MG capsule Take 1 capsule (100 mg total) by mouth 2 (two) times daily. 11/25/21  Yes Raylene Everts, MD  ?Red Yeast Rice Extract (RED YEAST RICE PO) Take by mouth.   Yes [provider]  ? ? ?Family History ?Family History  ?Problem Relation Age of Onset  ? Diabetes Mother   ? ? ?Social History ?Social History  ? ?Tobacco Use  ? Smoking status: Former  ?  Types: Cigarettes  ?  Quit date: 05/10/2011  ?  Years since quitting: 10.5   ? Smokeless tobacco: Never  ?Substance Use Topics  ? Alcohol use: Not Currently  ? Drug use: No  ? ? ? ?Allergies   ?Penicillins and Sulfa antibiotics ? ? ?Review of Systems ?Review of Systems ?See HPI ? ?Physical Exam ?Triage Vital Signs ?ED Triage Vitals  ?Enc Vitals Group  ?   BP 11/25/21 1638 (!) 136/92  ?   Pulse Rate 11/25/21 1638 85  ?   Resp 11/25/21 1638 18  ?   Temp 11/25/21 1638 98.3 ?F (36.8 ?C)  ?   Temp Source 11/25/21 1638 Oral  ?   SpO2 11/25/21 1638 97 %  ?   Weight 11/25/21 1634 158 lb (71.7 kg)  ?   Height 11/25/21 1634 5' 5.5" (1.664 m)  ?   Head Circumference --   ?   Peak Flow --   ?   Pain Score 11/25/21 1634 0  ?   Pain Loc --   ?   Pain Edu? --   ?   Excl. in [place]? --   ? ?No data found. ? ?Updated Vital Signs ?BP (!) 136/92 (BP Location: Right Arm)   Pulse 85   Temp 98.3 ?F (36.8 ?C) (Oral)   Resp 18   Ht 5' 5.5" (  1.664 m)   Wt 71.7 kg   SpO2 97%   BMI 25.89 kg/m?  ?    ? ?Physical Exam ?Constitutional:   ?   General: She is not in acute distress. ?   Appearance: She is well-developed.  ?HENT:  ?   Head: Normocephalic and atraumatic.  ?Eyes:  ?   Conjunctiva/sclera: Conjunctivae normal.  ?   Pupils: Pupils are equal, round, and reactive to light.  ?Cardiovascular:  ?   Rate and Rhythm: Normal rate.  ?Pulmonary:  ?   Effort: Pulmonary effort is normal. No respiratory distress.  ?Abdominal:  ?   General: There is no distension.  ?   Palpations: Abdomen is soft.  ?   Tenderness: There is no right CVA tenderness or left CVA tenderness.  ?Musculoskeletal:     ?   General: Normal range of motion.  ?   Cervical back: Normal range of motion.  ?Skin: ?   General: Skin is warm and dry.  ?Neurological:  ?   Mental Status: She is alert.  ?Psychiatric:     ?   Mood and Affect: Mood normal.     ?   Behavior: Behavior normal.  ? ? ? ?UC Treatments / Results  ?Labs ?(all labs ordered are listed, but only abnormal results are displayed) ?Labs Reviewed  ?POCT URINALYSIS DIP (MANUAL ENTRY) - Abnormal;  Notable for the following components:  ?    Result Value  ? Ketones, POC UA trace (5) (*)   ? Leukocytes, UA Trace (*)   ? All other components within normal limits  ?URINE CULTURE  ? ? ?EKG ? ? ?Radiology ?No results found. ? ?Procedures ?Procedures (including critical care time) ? ?Medications Ordered in UC ?Medications - No data to display ? ?Initial Impression / Assessment and Plan / UC Course  ?I have reviewed the triage vital signs and the nursing notes. ? ?Pertinent labs & imaging results that were available during my care of the patient were reviewed by me and considered in my medical decision making (see chart for details). ? ?  ? ?Patient complains of chills which is worrisome for pyelonephritis but she does not have flank pain, nausea vomiting, or physical exam tenderness.  We will treat her for cystitis.  Follow-up with primary care ?Final Clinical Impressions(s) / UC Diagnoses  ? ?Final diagnoses:  ?Bad odor of urine  ?Chills  ? ? ? ?Discharge Instructions   ? ?  ?Your urine has been sent to the laboratory for culture.  These results will be available in 2 to 3 days ?If your culture is negative you may stop the antibiotic early ?I have prescribed nitrofurantoin for your bladder infection.  Take 1 pill 2 times a day for 5 days ?Make sure you are drinking plenty of water ? ? ?ED Prescriptions   ? ? Medication Sig Dispense Auth. Provider  ? nitrofurantoin, macrocrystal-monohydrate, (MACROBID) 100 MG capsule Take 1 capsule (100 mg total) by mouth 2 (two) times daily. 10 capsule Raylene Everts, MD  ? ?  ? ?PDMP not reviewed this encounter. ?  ?Raylene Everts, MD ?11/25/21 1802 ? ?

## 2021-11-25 NOTE — Discharge Instructions (Signed)
Your urine has been sent to the laboratory for culture.  These results will be available in 2 to 3 days ?If your culture is negative you may stop the antibiotic early ?I have prescribed nitrofurantoin for your bladder infection.  Take 1 pill 2 times a day for 5 days ?Make sure you are drinking plenty of water ?

## 2021-11-27 LAB — URINE CULTURE
MICRO NUMBER:: 13288940
Result:: NO GROWTH
SPECIMEN QUALITY:: ADEQUATE

## 2022-11-01 ENCOUNTER — Encounter: Payer: Self-pay | Admitting: Emergency Medicine

## 2022-11-01 ENCOUNTER — Ambulatory Visit (INDEPENDENT_AMBULATORY_CARE_PROVIDER_SITE_OTHER): Payer: Medicare Other

## 2022-11-01 ENCOUNTER — Ambulatory Visit
Admission: EM | Admit: 2022-11-01 | Discharge: 2022-11-01 | Disposition: A | Discharge status: Home / Self Care | Payer: Medicare Other | Attending: Family Medicine | Admitting: Family Medicine

## 2022-11-01 DIAGNOSIS — J189 Pneumonia, unspecified organism: Secondary | ICD-10-CM | POA: Diagnosis not present

## 2022-11-01 DIAGNOSIS — R059 Cough, unspecified: Secondary | ICD-10-CM

## 2022-11-01 DIAGNOSIS — J4521 Mild intermittent asthma with (acute) exacerbation: Secondary | ICD-10-CM | POA: Diagnosis not present

## 2022-11-01 MED ORDER — LEVOFLOXACIN 500 MG PO TABS: 500.0000 mg | ORAL_TABLET | Freq: Every day | ORAL | 0 refills | Status: AC

## 2022-11-01 MED ORDER — PROMETHAZINE-DM 6.25-15 MG/5ML PO SYRP: 5.0000 mL | ORAL_SOLUTION | Freq: Four times a day (QID) | ORAL | 0 refills | Status: AC | PRN

## 2022-11-01 NOTE — Discharge Instructions (Addendum)
Take the antibiotic once a day until completed Take Phenergan DM cough medicine as needed.  This may make you drowsy. Must drink lots of fluids Run a humidifier if it is available Get plenty of rest If you decide to take the Tessalon for cough, take 2 pills instead of 1 See your doctor for follow-up in a couple weeks.  They may want a chest x-ray repeat

## 2022-11-01 NOTE — ED Provider Notes (Signed)
[name]    CSN: [id number] Arrival date & time: 11/01/22  1637      History   Chief Complaint No chief complaint on file.   HPI Margaret Mcgee is a 67 y.o. female.   HPI   Patient was seen by her personal doctor on 10/28/2022.  At that time she had been sick for 6 days.  She was diagnosed with a viral upper respiratory infection.  She was treated with Tessalon and a prednisone taper.  She states that she is still coughing.  She is no better.  She states that she is still on the prednisone.  She does have underlying asthma.  She states the Tessalon 100 mg is not helping her. Patient has well-controlled diabetes hypercholesterolemia and hypertension. Patient states she has a history of pneumonia.    Past Medical History:  Diagnosis Date   Anxiety    Depression    Diabetes mellitus without complication ([place])    Hypercholesteremia    Hypertension     Patient Active Problem List   Diagnosis Date Noted   Fracture of fibula, left, closed 04/23/2016    Past Surgical History:  Procedure Laterality Date   ANAL FISSURE REPAIR     CESAREAN SECTION     TONSILLECTOMY      OB History   No obstetric history on file.      Home Medications    Prior to Admission medications   Medication Sig Start Date End Date Taking? Authorizing Provider  amitriptyline (ELAVIL) 75 MG tablet Take by mouth. 09/30/15  Yes [provider]  fluticasone (FLONASE) 50 MCG/ACT nasal spray SPRAY 2 SPRAYS INTO EACH NOSTRIL EVERY DAY 03/22/16  Yes [provider]  levofloxacin (LEVAQUIN) 500 MG tablet Take 1 tablet (500 mg total) by mouth daily. 11/01/22  Yes Raylene Everts, MD  metFORMIN (GLUCOPHAGE) 500 MG tablet Take 500 mg by mouth 2 (two) times daily with a meal.   Yes [provider]  promethazine-dextromethorphan (PROMETHAZINE-DM) 6.25-15 MG/5ML syrup Take 5 mLs by mouth 4 (four) times daily as needed for cough. 11/01/22  Yes Raylene Everts, MD  Red  Yeast Rice Extract (RED YEAST RICE PO) Take by mouth.    [provider]    Family History Family History  Problem Relation Age of Onset   Diabetes Mother     Social History Social History   Tobacco Use   Smoking status: Former    Types: Cigarettes    Quit date: 05/10/2011    Years since quitting: 11.4   Smokeless tobacco: Never  Substance Use Topics   Alcohol use: Not Currently   Drug use: No     Allergies   Penicillins, Sulfa antibiotics, and Wild lettuce syrup   Review of Systems Review of Systems  See HPI Physical Exam Triage Vital Signs ED Triage Vitals  Enc Vitals Group     BP 11/01/22 1657 134/88     Pulse Rate 11/01/22 1657 (!) 118     Resp 11/01/22 1657 20     Temp 11/01/22 1657 98.3 F (36.8 C)     Temp Source 11/01/22 1657 Oral     SpO2 11/01/22 1657 97 %     Weight --      Height --      Head Circumference --      Peak Flow --      Pain Score 11/01/22 1654 0     Pain Loc --  Pain Edu? --      Excl. in [place]? --    No data found.  Updated Vital Signs BP 134/88   Pulse (!) 118   Temp 98.3 F (36.8 C) (Oral)   Resp 20   SpO2 97%   Physical Exam Constitutional:      General: She is not in acute distress.    Appearance: She is well-developed. She is ill-appearing.  HENT:     Head: Normocephalic and atraumatic.     Right Ear: Tympanic membrane and ear canal normal.     Left Ear: Tympanic membrane and ear canal normal.     Nose: Nose normal. No congestion.     Mouth/Throat:     Mouth: Mucous membranes are moist.     Pharynx: No posterior oropharyngeal erythema.  Eyes:     Conjunctiva/sclera: Conjunctivae normal.     Pupils: Pupils are equal, round, and reactive to light.  Cardiovascular:     Rate and Rhythm: Normal rate and regular rhythm.     Heart sounds: Normal heart sounds.  Pulmonary:     Effort: Pulmonary effort is normal. No respiratory distress.     Breath sounds: Wheezing and rhonchi present.     Comments: Few  scattered inspiratory wheeze.  Rhonchi right greater than left Abdominal:     General: There is no distension.     Palpations: Abdomen is soft.  Musculoskeletal:        General: Normal range of motion.     Cervical back: Normal range of motion.  Skin:    General: Skin is warm and dry.  Neurological:     Mental Status: She is alert.      UC Treatments / Results  Labs (all labs ordered are listed, but only abnormal results are displayed) Labs Reviewed - No data to display  EKG   Radiology DG Chest 2 View  Result Date: 11/01/2022 CLINICAL DATA:  Cough. EXAM: CHEST - 2 VIEW COMPARISON:  Chest x-ray 07/15/2017 FINDINGS: Faint patchy right upper lobe opacities are present. The lungs are otherwise clear. No pleural effusion or pneumothorax identified. Cardiomediastinal silhouette is within normal limits. No acute fractures are seen. IMPRESSION: Faint patchy right upper lobe opacities may be due to atelectasis or infection. Electronically Signed   By: Ronney Asters M.D.   On: 11/01/2022 17:40    Procedures Procedures (including critical care time)  Medications Ordered in UC Medications - No data to display  Initial Impression / Assessment and Plan / UC Course  I have reviewed the triage vital signs and the nursing notes.  Pertinent labs & imaging results that were available during my care of the patient were reviewed by me and considered in my medical decision making (see chart for details).     X-ray findings above appreciated.  Given her clinical picture I believe she does have pneumonia.  Will treat her with Levaquin because she does have underlying lung disease.  Also penicillin allergy.  Will give her Phenergan DM for the cough.  I told her she can double her Tessalon since its only 100 mg.  See PCP if not improving by next week.  Go to the ER if worse at any time. Final Clinical Impressions(s) / UC Diagnoses   Final diagnoses:  Community acquired pneumonia of right upper  lobe of lung  Mild intermittent asthma with acute exacerbation     Discharge Instructions      Take the antibiotic once a day until completed Take  Phenergan DM cough medicine as needed.  This may make you drowsy. Must drink lots of fluids Run a humidifier if it is available Get plenty of rest If you decide to take the Tessalon for cough, take 2 pills instead of 1 See your doctor for follow-up in a couple weeks.  They may want a chest x-ray repeat      ED Prescriptions     Medication Sig Dispense Auth. Provider   levofloxacin (LEVAQUIN) 500 MG tablet Take 1 tablet (500 mg total) by mouth daily. 7 tablet Raylene Everts, MD   promethazine-dextromethorphan (PROMETHAZINE-DM) 6.25-15 MG/5ML syrup Take 5 mLs by mouth 4 (four) times daily as needed for cough. 118 mL Raylene Everts, MD      PDMP not reviewed this encounter.   Raylene Everts, MD 11/01/22 1754

## 2022-11-01 NOTE — ED Triage Notes (Addendum)
Patient presents to [facility] for evaluation of cough and rnuny nose x 1 week.  Patient states she has been having urinary incontinence due to the coughing be so strong.  Patient states she had to sleep sitting up in the recliner the last few nights to be able to breathe well enough.  Hx of pneumonia.  Thursday negative for COVID and Flu
